# Patient Record
Sex: Male | Born: 1955 | Race: White | Hispanic: No | Marital: Single | Smoking: Former smoker
Health system: Southern US, Community
[De-identification: ages and names within clinical notes are randomized; demographics above are authoritative.]

## PROBLEM LIST (undated history)

## (undated) DIAGNOSIS — E119 Type 2 diabetes mellitus without complications: Secondary | ICD-10-CM

## (undated) DIAGNOSIS — Z6831 Body mass index (BMI) 31.0-31.9, adult: Secondary | ICD-10-CM

## (undated) DIAGNOSIS — E785 Hyperlipidemia, unspecified: Secondary | ICD-10-CM

## (undated) DIAGNOSIS — I10 Essential (primary) hypertension: Secondary | ICD-10-CM

## (undated) HISTORY — PX: JOINT REPLACEMENT: SHX530

## (undated) HISTORY — DX: Hyperlipidemia, unspecified: E78.5

## (undated) HISTORY — DX: Body mass index (BMI) 31.0-31.9, adult: Z68.31

## (undated) HISTORY — DX: Essential (primary) hypertension: I10

## (undated) HISTORY — DX: Type 2 diabetes mellitus without complications: E11.9

---

## 2020-04-01 ENCOUNTER — Ambulatory Visit (INDEPENDENT_AMBULATORY_CARE_PROVIDER_SITE_OTHER): Payer: 59 | Admitting: Osteopathic Medicine

## 2020-04-01 ENCOUNTER — Encounter: Payer: Self-pay | Admitting: Osteopathic Medicine

## 2020-04-01 VITALS — BP 116/73 | HR 96 | Wt 225.0 lb

## 2020-04-01 DIAGNOSIS — E1169 Type 2 diabetes mellitus with other specified complication: Secondary | ICD-10-CM | POA: Diagnosis not present

## 2020-04-01 DIAGNOSIS — J309 Allergic rhinitis, unspecified: Secondary | ICD-10-CM | POA: Diagnosis not present

## 2020-04-01 DIAGNOSIS — E119 Type 2 diabetes mellitus without complications: Secondary | ICD-10-CM

## 2020-04-01 DIAGNOSIS — I10 Essential (primary) hypertension: Secondary | ICD-10-CM

## 2020-04-01 DIAGNOSIS — E1159 Type 2 diabetes mellitus with other circulatory complications: Secondary | ICD-10-CM

## 2020-04-01 DIAGNOSIS — Z87891 Personal history of nicotine dependence: Secondary | ICD-10-CM

## 2020-04-01 DIAGNOSIS — Z125 Encounter for screening for malignant neoplasm of prostate: Secondary | ICD-10-CM

## 2020-04-01 DIAGNOSIS — E785 Hyperlipidemia, unspecified: Secondary | ICD-10-CM

## 2020-04-01 MED ORDER — LOSARTAN POTASSIUM 25 MG PO TABS
25.0000 mg | ORAL_TABLET | Freq: Every day | ORAL | 3 refills | Status: DC
Start: 2020-04-01 — End: 2021-04-27

## 2020-04-01 MED ORDER — ATORVASTATIN CALCIUM 80 MG PO TABS: 80.0000 mg | ORAL_TABLET | Freq: Every day | ORAL | 3 refills | Status: DC

## 2020-04-01 MED ORDER — LORATADINE 10 MG PO TABS: 10.0000 mg | ORAL_TABLET | Freq: Every day | ORAL | 3 refills | Status: DC

## 2020-04-01 MED ORDER — METFORMIN HCL 500 MG PO TABS: 500.0000 mg | ORAL_TABLET | Freq: Two times a day (BID) | ORAL | 3 refills | Status: DC

## 2020-04-01 NOTE — Progress Notes (Signed)
Brett Arnold is a 63 y.o. male who presents to  [place] at [facility]  today, 04/01/20, seeking care for the following: . New to establish  . DM2, HTN . Back pain      ASSESSMENT & PLAN with other pertinent history/findings:  The primary encounter diagnosis was Controlled type 2 diabetes mellitus without complication, without long-term current use of insulin ([place]). Diagnoses of Hyperlipidemia associated with type 2 diabetes mellitus ([place]), Hypertension associated with diabetes ([place]), Allergic rhinitis, unspecified seasonality, unspecified trigger, Former smoker, and Prostate cancer screening were also pertinent to this visit.   There are no Patient Instructions on file for this visit.   Orders Placed This Encounter  Procedures  . CBC  . COMPLETE METABOLIC PANEL WITH GFR  . Lipid panel  . Hemoglobin A1c  . PSA, Total with Reflex to PSA, Free    Meds ordered this encounter  Medications  . atorvastatin (LIPITOR) 80 MG tablet    Sig: Take 1 tablet (80 mg total) by mouth daily.    Dispense:  90 tablet    Refill:  3  . loratadine (CLARITIN) 10 MG tablet    Sig: Take 1 tablet (10 mg total) by mouth daily.    Dispense:  90 tablet    Refill:  3  . metFORMIN (GLUCOPHAGE) 500 MG tablet    Sig: Take 1 tablet (500 mg total) by mouth 2 (two) times daily with a meal.    Dispense:  180 tablet    Refill:  3  . losartan (COZAAR) 25 MG tablet    Sig: Take 1 tablet (25 mg total) by mouth daily.    Dispense:  90 tablet    Refill:  3       Follow-up instructions: Return in about 6 months (around 10/02/2020) for ANNUAL (call week prior to visit for lab orders).                                         BP 116/73 (BP Location: Right Arm, Patient Position: Sitting)   Pulse 96   Wt 225 lb (102.1 kg)   SpO2 96%   Current Meds  Medication Sig  . atorvastatin (LIPITOR) 80 MG tablet Take 1 tablet (80  mg total) by mouth daily.  Marland Kitchen loratadine (CLARITIN) 10 MG tablet Take 1 tablet (10 mg total) by mouth daily.  . metFORMIN (GLUCOPHAGE) 500 MG tablet Take 1 tablet (500 mg total) by mouth 2 (two) times daily with a meal.  . [DISCONTINUED] atorvastatin (LIPITOR) 80 MG tablet Take 80 mg by mouth daily.  . [DISCONTINUED] loratadine (CLARITIN) 10 MG tablet Take 10 mg by mouth daily.  . [DISCONTINUED] LOSARTAN POTASSIUM PO Take 25 mg by mouth daily.  . [DISCONTINUED] metFORMIN (GLUCOPHAGE) 500 MG tablet Take by mouth 2 (two) times daily with a meal.    No results found for this or any previous visit (from the past 72 hour(s)).  No results found.  Depression screen [facility] 2/9 04/01/2020  Decreased Interest 0  Down, Depressed, Hopeless 0  PHQ - 2 Score 0  Altered sleeping 0  Tired, decreased energy 1  Change in appetite 0  Feeling bad or failure about yourself  0  Trouble concentrating 0  Moving slowly or fidgety/restless 0  Suicidal thoughts 0  PHQ-9 Score 1    GAD 7 : Generalized Anxiety  Score 04/01/2020  Nervous, Anxious, on Edge 0  Control/stop worrying 0  Worry too much - different things 0  Trouble relaxing 0  Restless 0  Easily annoyed or irritable 0  Afraid - awful might happen 0  Total GAD 7 Score 0  Anxiety Difficulty Not difficult at all      All questions at time of visit were answered - patient instructed to contact office with any additional concerns or updates.  ER/RTC precautions were reviewed with the patient.  Please note: voice recognition software was used to produce this document, and typos may escape review. Please contact Dr. Lyn Hollingshead for any needed clarifications.

## 2020-04-08 LAB — PSA, TOTAL WITH REFLEX TO PSA, FREE: PSA, Total: 1.5 ng/mL (ref <=4.0)

## 2020-04-08 LAB — LIPID PANEL
Cholesterol: 165 mg/dL (ref <200)
HDL: 41 mg/dL (ref >=40)
LDL Cholesterol (Calc): 103 mg/dL (calc) — ABNORMAL HIGH
Non-HDL Cholesterol (Calc): 124 mg/dL (calc) (ref <130)
Total CHOL/HDL Ratio: 4 (calc) (ref <5.0)
Triglycerides: 114 mg/dL (ref <150)

## 2020-04-08 LAB — COMPLETE METABOLIC PANEL WITH GFR
AG Ratio: 1.9 (calc) (ref 1.0–2.5)
ALT: 31 U/L (ref 9–46)
AST: 15 U/L (ref 10–35)
Albumin: 4 g/dL (ref 3.6–5.1)
Alkaline phosphatase (APISO): 78 U/L (ref 35–144)
BUN: 12 mg/dL (ref 7–25)
CO2: 24 mmol/L (ref 20–32)
Calcium: 9.3 mg/dL (ref 8.6–10.3)
Chloride: 107 mmol/L (ref 98–110)
Creat: 0.9 mg/dL (ref 0.70–1.25)
GFR, Est African American: 106 mL/min/{1.73_m2} (ref >=60)
GFR, Est Non African American: 91 mL/min/{1.73_m2} (ref >=60)
Globulin: 2.1 g/dL (calc) (ref 1.9–3.7)
Glucose, Bld: 146 mg/dL — ABNORMAL HIGH (ref 65–99)
Potassium: 4.2 mmol/L (ref 3.5–5.3)
Sodium: 140 mmol/L (ref 135–146)
Total Bilirubin: 0.6 mg/dL (ref 0.2–1.2)
Total Protein: 6.1 g/dL (ref 6.1–8.1)

## 2020-04-08 LAB — CBC
HCT: 44.4 % (ref 38.5–50.0)
Hemoglobin: 14.8 g/dL (ref 13.2–17.1)
MCH: 29.6 pg (ref 27.0–33.0)
MCHC: 33.3 g/dL (ref 32.0–36.0)
MCV: 88.8 fL (ref 80.0–100.0)
MPV: 11.1 fL (ref 7.5–12.5)
Platelets: 274 10*3/uL (ref 140–400)
RBC: 5 10*6/uL (ref 4.20–5.80)
RDW: 12.1 % (ref 11.0–15.0)
WBC: 6.2 10*3/uL (ref 3.8–10.8)

## 2020-04-08 LAB — HEMOGLOBIN A1C
Hgb A1c MFr Bld: 7.1 % of total Hgb — ABNORMAL HIGH (ref <5.7)
Mean Plasma Glucose: 157 (calc)
eAG (mmol/L): 8.7 (calc)

## 2020-08-26 ENCOUNTER — Emergency Department (INDEPENDENT_AMBULATORY_CARE_PROVIDER_SITE_OTHER)
Admission: EM | Admit: 2020-08-26 | Discharge: 2020-08-26 | Disposition: A | Discharge status: Home / Self Care | Payer: 59 | Source: Home / Self Care

## 2020-08-26 ENCOUNTER — Other Ambulatory Visit: Payer: Self-pay

## 2020-08-26 ENCOUNTER — Telehealth: Payer: Self-pay

## 2020-08-26 DIAGNOSIS — J029 Acute pharyngitis, unspecified: Secondary | ICD-10-CM

## 2020-08-26 LAB — POCT RAPID STREP A (OFFICE): Rapid Strep A Screen: NEGATIVE

## 2020-08-26 NOTE — Telephone Encounter (Signed)
Pt called requesting an urgent appointment. Per pt, having a sore throat with some coughing since yesterday. Pt informed no appointments available today. Offered an appointment for Monday, however pt did not want to wait. Advised to seek care at [facility]. Pt was agreeable with the plan.

## 2020-08-26 NOTE — Discharge Instructions (Signed)
  You may take 500mg acetaminophen every 4-6 hours or in combination with ibuprofen 400-600mg every 6-8 hours as needed for pain, inflammation, and fever.  Be sure to well hydrated with clear liquids and get at least 8 hours of sleep at night, preferably more while sick.   Please follow up with family medicine in 1 week if needed.   

## 2020-08-26 NOTE — ED Triage Notes (Signed)
Pt developed a sore throat last night. Has used otc throat spray without any relief. Denies being around anyone sick. Not vaccinated for flu/covid.

## 2020-08-26 NOTE — ED Provider Notes (Signed)
[name]    CSN: [id number] Arrival date & time: 08/26/20  1045      History   Chief Complaint Chief Complaint  Patient presents with  . Sore Throat    HPI Brett Arnold is a 64 y.o. male.   HPI  Brett Arnold is a 64 y.o. male presenting to UC with c/o sore throat that started last night, pain is worse with swallowing. Pain is 2/10, burning and sore. He has tried chloraseptic spray.  Denies fever, chills, cough, congestion, HA or ear pain. He is not vaccinated for flu or COVID. Denies sick contacts or recent travel.    Past Medical History:  Diagnosis Date  . Diabetes (HCC)   . Hyperlipemia   . Hypertension     There are no problems to display for this patient.   History reviewed. No pertinent surgical history.     Home Medications    Prior to Admission medications   Medication Sig Start Date End Date Taking? Authorizing Provider  atorvastatin (LIPITOR) 80 MG tablet Take 1 tablet (80 mg total) by mouth daily. 04/01/20   Sunnie Nielsen, DO  loratadine (CLARITIN) 10 MG tablet Take 1 tablet (10 mg total) by mouth daily. 04/01/20   Sunnie Nielsen, DO  LORATADINE PO Take by mouth.    [provider]  losartan (COZAAR) 25 MG tablet Take 1 tablet (25 mg total) by mouth daily. 04/01/20   Sunnie Nielsen, DO  LOSARTAN POTASSIUM PO Take by mouth.    [provider]  metFORMIN (GLUCOPHAGE) 500 MG tablet Take 1 tablet (500 mg total) by mouth 2 (two) times daily with a meal. 04/01/20   Sunnie Nielsen, DO  METFORMIN HCL PO Take by mouth.    [provider]    Family History Family History  Problem Relation Age of Onset  . Emphysema Mother   . Lung disease Mother        lung cancer fluid  . Hypertension Father   . Stroke Father   . Heart failure Brother     Social History Social History   Tobacco Use  . Smoking status: Former Games developer  . Smokeless tobacco: Never Used  Vaping Use  . Vaping Use: Never used    Substance Use Topics  . Alcohol use: Yes    Comment: 2x month  . Drug use: Not Currently     Allergies   Patient has no known allergies.   Review of Systems Review of Systems  Constitutional: Negative for chills and fever.  HENT: Positive for sore throat. Negative for congestion, ear pain, trouble swallowing and voice change.   Respiratory: Negative for cough and shortness of breath.   Cardiovascular: Negative for chest pain and palpitations.  Gastrointestinal: Negative for abdominal pain, diarrhea, nausea and vomiting.  Musculoskeletal: Negative for arthralgias, back pain and myalgias.  Skin: Negative for rash.  All other systems reviewed and are negative.    Physical Exam Triage Vital Signs ED Triage Vitals  Enc Vitals Group     BP 08/26/20 1101 (!) 154/80     Pulse Rate 08/26/20 1101 74     Resp 08/26/20 1101 17     Temp 08/26/20 1101 98.6 F (37 C)     Temp Source 08/26/20 1101 Oral     SpO2 08/26/20 1101 95 %     Weight 08/26/20 1056 210 lb (95.3 kg)     Height 08/26/20 1056 5\' 10"  (1.778 m)     Head Circumference --  Peak Flow --      Pain Score 08/26/20 1056 2     Pain Loc --      Pain Edu? --      Excl. in GC? --    No data found.  Updated Vital Signs BP (!) 154/80 (BP Location: Left Arm)   Pulse 74   Temp 98.6 F (37 C) (Oral)   Resp 17   Ht 5\' 10"  (1.778 m)   Wt 210 lb (95.3 kg)   SpO2 95%   BMI 30.13 kg/m   Visual Acuity Right Eye Distance:   Left Eye Distance:   Bilateral Distance:    Right Eye Near:   Left Eye Near:    Bilateral Near:     Physical Exam Vitals and nursing note reviewed.  Constitutional:      General: He is not in acute distress.    Appearance: He is well-developed. He is not ill-appearing, toxic-appearing or diaphoretic.  HENT:     Head: Normocephalic and atraumatic.     Right Ear: Tympanic membrane and ear canal normal.     Left Ear: Tympanic membrane and ear canal normal.     Nose: Nose normal.      Mouth/Throat:     Lips: Pink.     Mouth: Mucous membranes are moist.     Pharynx: Oropharynx is clear. Uvula midline. Posterior oropharyngeal erythema present. No pharyngeal swelling, oropharyngeal exudate or uvula swelling.     Tonsils: No tonsillar exudate or tonsillar abscesses.  Cardiovascular:     Rate and Rhythm: Normal rate and regular rhythm.  Pulmonary:     Effort: Pulmonary effort is normal. No respiratory distress.     Breath sounds: Normal breath sounds. No stridor. No wheezing, rhonchi or rales.  Musculoskeletal:        General: Normal range of motion.     Cervical back: Normal range of motion and neck supple.  Lymphadenopathy:     Cervical: No cervical adenopathy.  Skin:    General: Skin is warm and dry.  Neurological:     Mental Status: He is alert and oriented to person, place, and time.  Psychiatric:        Behavior: Behavior normal.      UC Treatments / Results  Labs (all labs ordered are listed, but only abnormal results are displayed) Labs Reviewed  POCT RAPID STREP A (OFFICE)    EKG   Radiology No results found.  Procedures Procedures (including critical care time)  Medications Ordered in UC Medications - No data to display  Initial Impression / Assessment and Plan / UC Course  I have reviewed the triage vital signs and the nursing notes.  Pertinent labs & imaging results that were available during my care of the patient were reviewed by me and considered in my medical decision making (see chart for details).     Rapid strep: NEGATIVE Pt declined COVID testing Hx and exam c/w viral pharyngitis Encouraged symptomatic tx F/u with PCP  Final Clinical Impressions(s) / UC Diagnoses   Final diagnoses:  Acute pharyngitis, unspecified etiology     Discharge Instructions      You may take 500mg  acetaminophen every 4-6 hours or in combination with ibuprofen 400-600mg  every 6-8 hours as needed for pain, inflammation, and fever.  Be sure  to well hydrated with clear liquids and get at least 8 hours of sleep at night, preferably more while sick.   Please follow up with family medicine in 1 week  if needed.     ED Prescriptions    None     PDMP not reviewed this encounter.   Lurene Shadow, [place] 08/26/20 1137

## 2020-08-28 LAB — CULTURE, GROUP A STREP
MICRO NUMBER:: 11111901
SPECIMEN QUALITY:: ADEQUATE

## 2020-08-28 LAB — STREP A DNA PROBE

## 2020-09-01 ENCOUNTER — Other Ambulatory Visit: Payer: Self-pay

## 2020-09-01 ENCOUNTER — Encounter: Payer: Self-pay | Admitting: Osteopathic Medicine

## 2020-09-01 ENCOUNTER — Ambulatory Visit (INDEPENDENT_AMBULATORY_CARE_PROVIDER_SITE_OTHER): Payer: 59 | Admitting: Osteopathic Medicine

## 2020-09-01 VITALS — BP 145/85 | HR 65 | Temp 97.5°F | Wt 221.1 lb

## 2020-09-01 DIAGNOSIS — Z20822 Contact with and (suspected) exposure to covid-19: Secondary | ICD-10-CM

## 2020-09-01 DIAGNOSIS — J3489 Other specified disorders of nose and nasal sinuses: Secondary | ICD-10-CM | POA: Diagnosis not present

## 2020-09-01 DIAGNOSIS — R059 Cough, unspecified: Secondary | ICD-10-CM

## 2020-09-01 DIAGNOSIS — B9789 Other viral agents as the cause of diseases classified elsewhere: Secondary | ICD-10-CM

## 2020-09-01 DIAGNOSIS — J988 Other specified respiratory disorders: Secondary | ICD-10-CM

## 2020-09-01 MED ORDER — AMBULATORY NON FORMULARY MEDICATION: 99 refills | Status: AC

## 2020-09-01 MED ORDER — METFORMIN HCL ER 500 MG PO TB24: 500.0000 mg | ORAL_TABLET | Freq: Every day | ORAL | 11 refills | Status: DC

## 2020-09-01 MED ORDER — IPRATROPIUM BROMIDE 0.06 % NA SOLN: 2.0000 | Freq: Four times a day (QID) | NASAL | 1 refills | Status: DC

## 2020-09-01 MED ORDER — PREDNISONE 20 MG PO TABS: 20.0000 mg | ORAL_TABLET | Freq: Two times a day (BID) | ORAL | 0 refills | Status: DC

## 2020-09-01 NOTE — Progress Notes (Signed)
HPI: Brett Arnold is a 64 y.o. male who  has a past medical history of Diabetes (HCC), Hyperlipemia, and Hypertension.  he presents to [facility] today, 09/01/20,  for chief complaint of:  Follow up for sore throat / new sinus congestion R hand and forearm swelling   Sore throat began 10/22 and went to ED where he was diagnosed with viral pharyngitis. He used alleve at home and rested. Sore throat cleared after a day (10/25) then congestion began 10/27 after sore throat cleared.   He is currently not experiencing a sore throat, but does endorse congestion, nasal drainage, and cough productive for green tinged sputum. He denies fever, headache, change to smell or taste, nausea, vomiting, shortness of breath, or sick contacts. He is not vaccinated for flu or COVID19.     He also complains of R arm swelling that has been persistent for about 2 weeks since his last hike. He states that he normally has some swelling after a hike, but that it has never been this persistent in the past. He denies pain, but endorses a "stretching" feeling and is unable to make a fist due to this. He has not had any recent trauma to the R arm or shoulder.     Unrelated to current presentation patient endorses chronic diarrhea since metformin medication change a few months ago. He also is requesting aperscription for CPAP supplies.       Past medical, surgical, social and family history reviewed:  There are no problems to display for this patient.   No past surgical history on file.  Social History   Tobacco Use   Smoking status: Former Smoker   Smokeless tobacco: Never Used  Substance Use Topics   Alcohol use: Yes    Comment: 2x month    Family History  Problem Relation Age of Onset   Emphysema Mother    Lung disease Mother        lung cancer fluid   Hypertension Father    Stroke Father    Heart failure Brother      Current medication list and  allergy/intolerance information reviewed:    Current Outpatient Medications  Medication Sig Dispense Refill   atorvastatin (LIPITOR) 80 MG tablet Take 1 tablet (80 mg total) by mouth daily. 90 tablet 3   loratadine (CLARITIN) 10 MG tablet Take 1 tablet (10 mg total) by mouth daily. 90 tablet 3   LORATADINE PO Take by mouth.     losartan (COZAAR) 25 MG tablet Take 1 tablet (25 mg total) by mouth daily. 90 tablet 3   LOSARTAN POTASSIUM PO Take by mouth.     metFORMIN (GLUCOPHAGE) 500 MG tablet Take 1 tablet (500 mg total) by mouth 2 (two) times daily with a meal. 180 tablet 3   METFORMIN HCL PO Take by mouth.     ipratropium (ATROVENT) 0.06 % nasal spray Place 2 sprays into both nostrils 4 (four) times daily. As needed for runny nose / postnasal drip 15 mL 1   predniSONE (DELTASONE) 20 MG tablet Take 1 tablet (20 mg total) by mouth 2 (two) times daily with a meal. 10 tablet 0   No current facility-administered medications for this visit.    No Known Allergies    Review of Systems:  Constitutional:  No  fever, no chills.  HEENT: No  headache, No sore throat, + congestion, + nasal drainage, no change in smell, no change in taste  Respiratory:  No  shortness of breath. +  Cough (green sputum)  Gastrointestinal: No  abdominal pain, No  nausea, No  vomiting,   +  diarrhea, No  constipation   Musculoskeletal: No new myalgia/arthralgia  Cardio: + extremity swelling in R arm   Neurologic: No  weakness, No  dizziness, No  slurred speech/focal weakness/facial droop  Psychiatric: No  concerns with depression, No  concerns with anxiety, No sleep problems, No mood problems  Exam:  BP (!) 145/85 (BP Location: Left Arm, Patient Position: Sitting, Cuff Size: Large)    Pulse 65    Temp (!) 97.5 F (36.4 C) (Oral)    Wt 221 lb 1.9 oz (100.3 kg)    BMI 31.73 kg/m   Constitutional: VS see above. General Appearance: alert, well-developed, well-nourished, NAD  Eyes: Normal lids and  conjunctive, non-icteric sclera  Ears, Nose, Mouth, Throat: MMM, Normal external inspection ears/nares/mouth/lips/gums. TM normal bilaterally. Pharynx/tonsils no erythema,  Nasal mucosa normal. Some nasal discharge present.  Neck: No masses, trachea midline. . No tenderness/mass appreciated. No lymphadenopathy  Respiratory: Normal respiratory effort. No wheeze, no rhonchi, no rales  Cardiovascular: S1/S2 normal, no murmur, no rub/gallop auscultated. RRR.  Gastrointestinal: Soft, Nontender, no masses. No hepatomegaly, no splenomegaly. No hernia appreciated.Rectal exam deferred.   Skin: warm, dry, intact.   MSK: Swelling present from R hand to halfway up R forearm     No results found for this or any previous visit (from the past 72 hour(s)).  No results found.   ASSESSMENT/PLAN: The primary encounter diagnosis was Rhinorrhea. Diagnoses of Cough, Viral respiratory illness, and Suspected COVID-19 virus infection were also pertinent to this visit.   Rhinorrhea: Concern for COVID19 v other viral illness v allergies  COVID19 swab  Tx symptoms w/ 5 day course of prednisone, ipratropium nasal spray   DM2 w/ side effects of metformin causing diarrhea:   Switch back to metformin XR due to side effect with instant release, if no better will d/c metformin altogether   Sleep apnea  Using and benefiting from CPAP for OSA.  Will reorder CPAP supplies     Orders Placed This Encounter  Procedures   Novel Coronavirus, NAA ([facility])    Meds ordered this encounter  Medications   predniSONE (DELTASONE) 20 MG tablet    Sig: Take 1 tablet (20 mg total) by mouth 2 (two) times daily with a meal.    Dispense:  10 tablet    Refill:  0   ipratropium (ATROVENT) 0.06 % nasal spray    Sig: Place 2 sprays into both nostrils 4 (four) times daily. As needed for runny nose / postnasal drip    Dispense:  15 mL    Refill:  1    Patient Instructions  Plan:  COVID swab today. Results may  take a couple days to come back.   Please ALWAYS WEAR MASK COVERING NOSE/MOUTH when out of the house.   Hopefully this is just allergies. It's ragweed season! Will treat w/ steroids and nasal spray to calm this down. For long-term allergy treatment, recommend Claritin or similar antihistamine +/- Flonase or similar steroid nasal spray.   Will go back to Metformin XR  Will reorder CPAP supplies  Please use wrist brace for a few weeks during day AND night. If swelling persists or gets worse please schedule follow-up with Dr T our sports med specialist here in the office.         Visit summary with medication list and pertinent instructions was printed for  patient to review. All questions at time of visit were answered - patient instructed to contact office with any additional concerns or updates. ER/RTC precautions were reviewed with the patient.     Follow-up plan: Return if symptoms worsen or fail to improve.

## 2020-09-01 NOTE — Patient Instructions (Signed)
Plan:  COVID swab today. Results may take a couple days to come back.   Please ALWAYS WEAR MASK COVERING NOSE/MOUTH when out of the house.   Hopefully this is just allergies. It's ragweed season! Will treat w/ steroids and nasal spray to calm this down. For long-term allergy treatment, recommend Claritin or similar antihistamine +/- Flonase or similar steroid nasal spray.   Will go back to Metformin XR  Will reorder CPAP supplies  Please use wrist brace for a few weeks during day AND night. If swelling persists or gets worse please schedule follow-up with Dr T our sports med specialist here in the office.

## 2020-09-03 LAB — NOVEL CORONAVIRUS, NAA: SARS-CoV-2, NAA: NOT DETECTED

## 2020-09-03 LAB — SARS-COV-2, NAA 2 DAY TAT

## 2020-09-20 ENCOUNTER — Encounter: Payer: Self-pay | Admitting: Osteopathic Medicine

## 2020-10-17 ENCOUNTER — Other Ambulatory Visit: Payer: Self-pay

## 2020-10-17 ENCOUNTER — Emergency Department (INDEPENDENT_AMBULATORY_CARE_PROVIDER_SITE_OTHER): Payer: 59

## 2020-10-17 ENCOUNTER — Emergency Department (INDEPENDENT_AMBULATORY_CARE_PROVIDER_SITE_OTHER)
Admission: EM | Admit: 2020-10-17 | Discharge: 2020-10-17 | Disposition: A | Discharge status: Home / Self Care | Payer: 59 | Source: Home / Self Care | Attending: Family Medicine | Admitting: Family Medicine

## 2020-10-17 DIAGNOSIS — R911 Solitary pulmonary nodule: Secondary | ICD-10-CM | POA: Diagnosis not present

## 2020-10-17 DIAGNOSIS — U071 COVID-19: Secondary | ICD-10-CM | POA: Diagnosis not present

## 2020-10-17 DIAGNOSIS — J1282 Pneumonia due to coronavirus disease 2019: Secondary | ICD-10-CM

## 2020-10-17 DIAGNOSIS — R059 Cough, unspecified: Secondary | ICD-10-CM

## 2020-10-17 DIAGNOSIS — J069 Acute upper respiratory infection, unspecified: Secondary | ICD-10-CM

## 2020-10-17 MED ORDER — ACETAMINOPHEN 325 MG PO TABS
650.0000 mg | ORAL_TABLET | Freq: Four times a day (QID) | ORAL | Status: DC | PRN
  Administered 2020-10-17: 18:00:00 650 mg via ORAL

## 2020-10-17 MED ORDER — DOXYCYCLINE HYCLATE 100 MG PO CAPS: 100.0000 mg | ORAL_CAPSULE | Freq: Two times a day (BID) | ORAL | 0 refills | Status: DC

## 2020-10-17 MED ORDER — AZITHROMYCIN 100 MG/5ML PO SUSR: 100.0000 mg | Freq: Every day | ORAL | 0 refills | Status: DC

## 2020-10-17 NOTE — ED Provider Notes (Signed)
[facility]    CSN: [id number] Arrival date & time: 10/17/20  1753      History   Chief Complaint Chief Complaint  Patient presents with  . Cough    HPI JAICION LAURIE is a 64 y.o. male.   1 week history of mostly dry cough but now with fever and chills.  No known exposure to Covid but he has not been vaccinated or tested recently.  HPI  Past Medical History:  Diagnosis Date  . Diabetes (HCC)   . Hyperlipemia   . Hypertension     There are no problems to display for this patient.   History reviewed. No pertinent surgical history.     Home Medications    Prior to Admission medications   Medication Sig Start Date End Date Taking? Authorizing Provider  AMBULATORY NON FORMULARY MEDICATION Supply ordered: CPAP and other supplies needed (headgear, cushions, filters, heated tuubing and water chamber) Dx: obstructive sleep apnea Settings: auto-titration 09/01/20   Sunnie Nielsen, DO  atorvastatin (LIPITOR) 80 MG tablet Take 1 tablet (80 mg total) by mouth daily. 04/01/20   Sunnie Nielsen, DO  ipratropium (ATROVENT) 0.06 % nasal spray Place 2 sprays into both nostrils 4 (four) times daily. As needed for runny nose / postnasal drip 09/01/20   Sunnie Nielsen, DO  loratadine (CLARITIN) 10 MG tablet Take 1 tablet (10 mg total) by mouth daily. 04/01/20   Sunnie Nielsen, DO  LORATADINE PO Take by mouth.    [provider]  losartan (COZAAR) 25 MG tablet Take 1 tablet (25 mg total) by mouth daily. 04/01/20   Sunnie Nielsen, DO  LOSARTAN POTASSIUM PO Take by mouth.    [provider]  metFORMIN (GLUCOPHAGE XR) 500 MG 24 hr tablet Take 1 tablet (500 mg total) by mouth daily with breakfast. 09/01/20 09/01/21  Sunnie Nielsen, DO  predniSONE (DELTASONE) 20 MG tablet Take 1 tablet (20 mg total) by mouth 2 (two) times daily with a meal. 09/01/20   Sunnie Nielsen, DO    Family History Family History  Problem Relation Age of Onset   . Emphysema Mother   . Lung disease Mother        lung cancer fluid  . Hypertension Father   . Stroke Father   . Heart failure Brother     Social History Social History   Tobacco Use  . Smoking status: Former Games developer  . Smokeless tobacco: Never Used  Vaping Use  . Vaping Use: Never used  Substance Use Topics  . Alcohol use: Yes    Comment: 2x month  . Drug use: Not Currently     Allergies   Patient has no known allergies.   Review of Systems Review of Systems  Constitutional: Positive for fever.  HENT: Positive for congestion.   Respiratory: Positive for cough.   All other systems reviewed and are negative.    Physical Exam Triage Vital Signs ED Triage Vitals  Enc Vitals Group     BP 10/17/20 1814 (!) 150/87     Pulse Rate 10/17/20 1814 93     Resp 10/17/20 1814 16     Temp 10/17/20 1814 (!) 100.5 F (38.1 C)     Temp Source 10/17/20 1814 Oral     SpO2 10/17/20 1814 96 %     Weight --      Height --      Head Circumference --      Peak Flow --      Pain  Score 10/17/20 1812 0     Pain Loc --      Pain Edu? --      Excl. in GC? --    No data found.  Updated Vital Signs BP (!) 150/87 (BP Location: Right Arm)   Pulse 93   Temp (!) 100.5 F (38.1 C) (Oral)   Resp 16   SpO2 96%   Visual Acuity Right Eye Distance:   Left Eye Distance:   Bilateral Distance:    Right Eye Near:   Left Eye Near:    Bilateral Near:     Physical Exam Vitals and nursing note reviewed.  Constitutional:      Appearance: Normal appearance.  HENT:     Head: Normocephalic.     Nose: Nose normal.     Mouth/Throat:     Mouth: Mucous membranes are moist.  Cardiovascular:     Rate and Rhythm: Normal rate and regular rhythm.  Pulmonary:     Effort: Pulmonary effort is normal.     Breath sounds: Wheezing and rhonchi present.  Neurological:     Mental Status: He is alert.      UC Treatments / Results  Labs (all labs ordered are listed, but only abnormal results  are displayed) Labs Reviewed  NOVEL CORONAVIRUS, NAA    EKG   Radiology No results found.  Procedures Procedures (including critical care time)  Medications Ordered in UC Medications  acetaminophen (TYLENOL) tablet 650 mg (650 mg Oral Given 10/17/20 1819)    Initial Impression / Assessment and Plan / UC Course  I have reviewed the triage vital signs and the nursing notes.  Pertinent labs & imaging results that were available during my care of the patient were reviewed by me and considered in my medical decision making (see chart for details).     Pneumonia Final Clinical Impressions(s) / UC Diagnoses   Final diagnoses:  Cough   Discharge Instructions   None    ED Prescriptions    None     PDMP not reviewed this encounter.   Frederica Kuster, MD 10/17/20 1911

## 2020-10-17 NOTE — ED Triage Notes (Signed)
Patient presents to Urgent Care with complaints of dry cough since a little over a week now. Patient reports he feels dehydrated but has had about 12 bottles of water, is a known diabetic and takes medication as directed. Pt reports feeling like he cannot get warm, which is unusual for him.  Pt has not been vaccinated or tested for covid recently.

## 2020-10-19 ENCOUNTER — Telehealth: Payer: Self-pay

## 2020-10-19 LAB — SARS-COV-2, NAA 2 DAY TAT

## 2020-10-19 LAB — NOVEL CORONAVIRUS, NAA: SARS-CoV-2, NAA: DETECTED — AB

## 2020-10-19 NOTE — Telephone Encounter (Signed)
Brett Arnold states he is positive for COVID and wanted to know if he could get the infusion.

## 2020-10-20 ENCOUNTER — Encounter: Payer: Self-pay | Admitting: Physician Assistant

## 2020-10-20 ENCOUNTER — Other Ambulatory Visit: Payer: Self-pay | Admitting: Family

## 2020-10-20 ENCOUNTER — Other Ambulatory Visit: Payer: Self-pay | Admitting: Physician Assistant

## 2020-10-20 ENCOUNTER — Ambulatory Visit (HOSPITAL_COMMUNITY)
Admission: RE | Admit: 2020-10-20 | Discharge: 2020-10-20 | Disposition: A | Discharge status: Home / Self Care | Payer: 59 | Source: Ambulatory Visit | Attending: Pulmonary Disease | Admitting: Pulmonary Disease

## 2020-10-20 DIAGNOSIS — I10 Essential (primary) hypertension: Secondary | ICD-10-CM

## 2020-10-20 DIAGNOSIS — Z6831 Body mass index (BMI) 31.0-31.9, adult: Secondary | ICD-10-CM | POA: Insufficient documentation

## 2020-10-20 DIAGNOSIS — J1282 Pneumonia due to coronavirus disease 2019: Secondary | ICD-10-CM | POA: Diagnosis not present

## 2020-10-20 DIAGNOSIS — U071 COVID-19: Secondary | ICD-10-CM | POA: Insufficient documentation

## 2020-10-20 DIAGNOSIS — E1169 Type 2 diabetes mellitus with other specified complication: Secondary | ICD-10-CM | POA: Diagnosis not present

## 2020-10-20 DIAGNOSIS — E119 Type 2 diabetes mellitus without complications: Secondary | ICD-10-CM | POA: Insufficient documentation

## 2020-10-20 DIAGNOSIS — E785 Hyperlipidemia, unspecified: Secondary | ICD-10-CM | POA: Insufficient documentation

## 2020-10-20 MED ORDER — FAMOTIDINE IN NACL 20-0.9 MG/50ML-% IV SOLN: 20.0000 mg | Freq: Once | INTRAVENOUS | Status: DC | PRN

## 2020-10-20 MED ORDER — SODIUM CHLORIDE 0.9 % IV SOLN: Freq: Once | INTRAVENOUS | Status: AC

## 2020-10-20 MED ORDER — ALBUTEROL SULFATE HFA 108 (90 BASE) MCG/ACT IN AERS: 2.0000 | INHALATION_SPRAY | Freq: Once | RESPIRATORY_TRACT | Status: DC | PRN

## 2020-10-20 MED ORDER — ACETAMINOPHEN 325 MG PO TABS
650.0000 mg | ORAL_TABLET | Freq: Four times a day (QID) | ORAL | Status: DC | PRN
  Administered 2020-10-20: 17:00:00 650 mg via ORAL
  Filled 2020-10-20: qty 2

## 2020-10-20 MED ORDER — SODIUM CHLORIDE 0.9 % IV BOLUS
1000.0000 mL | Freq: Once | INTRAVENOUS | Status: AC
  Administered 2020-10-20: 1000 mL via INTRAVENOUS

## 2020-10-20 MED ORDER — DIPHENHYDRAMINE HCL 50 MG/ML IJ SOLN: 50.0000 mg | Freq: Once | INTRAMUSCULAR | Status: DC | PRN

## 2020-10-20 MED ORDER — METHYLPREDNISOLONE SODIUM SUCC 125 MG IJ SOLR: 125.0000 mg | Freq: Once | INTRAMUSCULAR | Status: DC | PRN

## 2020-10-20 MED ORDER — BENZONATATE 100 MG PO CAPS: 100.0000 mg | ORAL_CAPSULE | Freq: Four times a day (QID) | ORAL | 1 refills | Status: DC | PRN

## 2020-10-20 MED ORDER — SODIUM CHLORIDE 0.9 % IV SOLN: INTRAVENOUS | Status: DC | PRN

## 2020-10-20 MED ORDER — EPINEPHRINE 0.3 MG/0.3ML IJ SOAJ: 0.3000 mg | Freq: Once | INTRAMUSCULAR | Status: DC | PRN

## 2020-10-20 NOTE — Progress Notes (Signed)
Patient reviewed Fact Sheet for Patients, Parents, and Caregivers for Emergency Use Authorization (EUA) of bamlanivimab and etesevimab for the Treatment of Coronavirus. Patient also reviewed and is agreeable to the estimated cost of treatment. Patient is agreeable to proceed.   

## 2020-10-20 NOTE — Discharge Instructions (Signed)
10 Things You Can Do to Manage Your COVID-19 Symptoms at Home If you have possible or confirmed COVID-19: 1. Stay home from work and school. And stay away from other public places. If you must go out, avoid using any kind of public transportation, ridesharing, or taxis. 2. Monitor your symptoms carefully. If your symptoms get worse, call your healthcare provider immediately. 3. Get rest and stay hydrated. 4. If you have a medical appointment, call the healthcare provider ahead of time and tell them that you have or may have COVID-19. 5. For medical emergencies, call 911 and notify the dispatch personnel that you have or may have COVID-19. 6. Cover your cough and sneezes with a tissue or use the inside of your elbow. 7. Wash your hands often with soap and water for at least 20 seconds or clean your hands with an alcohol-based hand sanitizer that contains at least 60% alcohol. 8. As much as possible, stay in a specific room and away from other people in your home. Also, you should use a separate bathroom, if available. If you need to be around other people in or outside of the home, wear a mask. 9. Avoid sharing personal items with other people in your household, like dishes, towels, and bedding. 10. Clean all surfaces that are touched often, like counters, tabletops, and doorknobs. Use household cleaning sprays or wipes according to the label instructions. cdc.gov/coronavirus 05/06/2019 This information is not intended to replace advice given to you by your health care provider. Make sure you discuss any questions you have with your health care provider. Document Revised: 10/08/2019 Document Reviewed: 10/08/2019 Elsevier Patient Education  2020 Elsevier Inc. What types of side effects do monoclonal antibody drugs cause?  Common side effects  In general, the more common side effects caused by monoclonal antibody drugs include: . Allergic reactions, such as hives or itching . Flu-like signs and  symptoms, including chills, fatigue, fever, and muscle aches and pains . Nausea, vomiting . Diarrhea . Skin rashes . Low blood pressure   The CDC is recommending patients who receive monoclonal antibody treatments wait at least 90 days before being vaccinated.  Currently, there are no data on the safety and efficacy of mRNA COVID-19 vaccines in persons who received monoclonal antibodies or convalescent plasma as part of COVID-19 treatment. Based on the estimated half-life of such therapies as well as evidence suggesting that reinfection is uncommon in the 90 days after initial infection, vaccination should be deferred for at least 90 days, as a precautionary measure until additional information becomes available, to avoid interference of the antibody treatment with vaccine-induced immune responses. If you have any questions or concerns after the infusion please call the Advanced Practice Provider on call at [phone]. This number is ONLY intended for your use regarding questions or concerns about the infusion post-treatment side-effects.  Please do not provide this number to others for use. For return to work notes please contact your primary care provider.   If someone you know is interested in receiving treatment please have them call the COVID hotline at [phone].   

## 2020-10-20 NOTE — Progress Notes (Signed)
I connected by phone with Brett Arnold on 10/20/2020 at 9:25 AM to discuss the potential use of a new treatment for mild to moderate COVID-19 viral infection in non-hospitalized patients.  This patient is a 64 y.o. male that meets the FDA criteria for Emergency Use Authorization of COVID monoclonal antibody sotrovimab, casirivimab/imdevimab or bamlamivimab/estevimab.  Has a (+) direct SARS-CoV-2 viral test result  Has mild or moderate COVID-19   Is NOT hospitalized due to COVID-19  Is within 10 days of symptom onset  Has at least one of the high risk factor(s) for progression to severe COVID-19 and/or hospitalization as defined in EUA.  Specific high risk criteria : BMI > 25, Diabetes and Cardiovascular disease or hypertension   I have spoken and communicated the following to the patient or parent/caregiver regarding COVID monoclonal antibody treatment:  1. FDA has authorized the emergency use for the treatment of mild to moderate COVID-19 in adults and pediatric patients with positive results of direct SARS-CoV-2 viral testing who are 81 years of age and older weighing at least 40 kg, and who are at high risk for progressing to severe COVID-19 and/or hospitalization.  2. The significant known and potential risks and benefits of COVID monoclonal antibody, and the extent to which such potential risks and benefits are unknown.  3. Information on available alternative treatments and the risks and benefits of those alternatives, including clinical trials.  4. Patients treated with COVID monoclonal antibody should continue to self-isolate and use infection control measures (e.g., wear mask, isolate, social distance, avoid sharing personal items, clean and disinfect "high touch" surfaces, and frequent handwashing) according to CDC guidelines.   5. The patient or parent/caregiver has the option to accept or refuse COVID monoclonal antibody treatment.  After reviewing this information with the  patient, the patient has agreed to receive one of the available covid 19 monoclonal antibodies and will be provided an appropriate fact sheet prior to infusion.  Sx onset 12/6. Set up for infusion on 12/16 @ 4:30pm. Directions given to [facility]. Pt is aware that insurance will be charged an infusion fee. Pt is unvaccinated. + in epic.   Cline Crock 10/20/2020 9:25 AM

## 2020-10-20 NOTE — Telephone Encounter (Addendum)
With his history of diabetes, hypertension, and BMI of 31.73, I would strongly encourage the infusion. He has to be within 10 days of symptom onset to qualify. It looks like based on the UC note he is day 10 today. I reached out to the infusion clinic people working today and asked them to give him a call in hopes that we can get him on the schedule this afternoon if he is interested. Please let him know that someone will be calling and to be sure to answer the phone- with the volumes as high as they are, they may not be able to circle back around to call him again today if they miss him.   Thank you!

## 2020-11-23 ENCOUNTER — Encounter: Payer: Self-pay | Admitting: Osteopathic Medicine

## 2020-11-23 DIAGNOSIS — M19049 Primary osteoarthritis, unspecified hand: Secondary | ICD-10-CM

## 2020-11-23 NOTE — Telephone Encounter (Signed)
Referral pended for provider.  

## 2020-12-02 ENCOUNTER — Ambulatory Visit (INDEPENDENT_AMBULATORY_CARE_PROVIDER_SITE_OTHER): Payer: 59

## 2020-12-02 ENCOUNTER — Ambulatory Visit: Payer: 59 | Admitting: Orthopaedic Surgery

## 2020-12-02 ENCOUNTER — Encounter: Payer: Self-pay | Admitting: Orthopaedic Surgery

## 2020-12-02 DIAGNOSIS — M79641 Pain in right hand: Secondary | ICD-10-CM

## 2020-12-02 MED ORDER — LIDOCAINE HCL 1 % IJ SOLN
0.3000 mL | INTRAMUSCULAR | Status: AC | PRN
  Administered 2020-12-02: .3 mL

## 2020-12-02 MED ORDER — BUPIVACAINE HCL 0.5 % IJ SOLN
0.3300 mL | INTRAMUSCULAR | Status: AC | PRN
  Administered 2020-12-02: .33 mL

## 2020-12-02 MED ORDER — METHYLPREDNISOLONE ACETATE 40 MG/ML IJ SUSP
13.3300 mg | INTRAMUSCULAR | Status: AC | PRN
Start: 2020-12-02 — End: 2020-12-02
  Administered 2020-12-02: 13.33 mg

## 2020-12-02 NOTE — Progress Notes (Signed)
Office Visit Note   Patient: Brett Arnold           Date of Birth: [birth year]           MRN: [id number] Visit Date: 12/02/2020              Requested by: Sunnie Nielsen, DO [address] [place],  [place] [address] PCP: Sunnie Nielsen, DO   Assessment & Plan: Visit Diagnoses:  1. Pain in right hand     Plan: Impression is right middle finger stenosing tenosynovitis.  We injected this today and will monitor his response to this.  He has been instructed to follow-up in about a month if he does not notice any improvement.  Follow-Up Instructions: Return if symptoms worsen or fail to improve.   Orders:  Orders Placed This Encounter  Procedures  . XR Hand Complete Right   No orders of the defined types were placed in this encounter.     Procedures: Hand/UE Inj: R long A1 for trigger finger on 12/02/2020 11:23 AM Indications: pain Details: 25 G needle Medications: 0.3 mL lidocaine 1 %; 0.33 mL bupivacaine 0.5 %; 13.33 mg methylPREDNISolone acetate 40 MG/ML Outcome: tolerated well, no immediate complications Consent was given by the patient. Patient was prepped and draped in the usual sterile fashion.       Clinical Data: No additional findings.   Subjective: Chief Complaint  Patient presents with  . Right Hand - Pain    Brett Arnold is 65 year old gentleman here for evaluation of right hand pain.  He has had a trigger finger that comes and goes.  He is a Medical illustrator for furniture.  He has trouble making a fist due to the pain.  His pain is mainly localized to the base of the middle finger and the palmar crease.  Denies any numbness and tingling or injuries.  Aleve helps only mildly and partially.   Review of Systems  Constitutional: Negative.   All other systems reviewed and are negative.    Objective: Vital Signs: There were no vitals taken for this visit.  Physical Exam Vitals and nursing note reviewed.  Constitutional:      Appearance: He is  well-developed and well-nourished.  HENT:     Head: Normocephalic and atraumatic.  Eyes:     Pupils: Pupils are equal, round, and reactive to light.  Pulmonary:     Effort: Pulmonary effort is normal.  Abdominal:     Palpations: Abdomen is soft.  Musculoskeletal:        General: Normal range of motion.     Cervical back: Neck supple.  Skin:    General: Skin is warm.  Neurological:     Mental Status: He is alert and oriented to person, place, and time.  Psychiatric:        Mood and Affect: Mood and affect normal.        Behavior: Behavior normal.        Thought Content: Thought content normal.        Judgment: Judgment normal.     Ortho Exam Right hand shows tenderness at the metacarpal head of the middle finger.  There is no comparable masses.  No obvious triggering.  No neurovascular compromise. Specialty Comments:  No specialty comments available.  Imaging: XR Hand Complete Right  Result Date: 12/02/2020 No acute or structural abnormalities.    PMFS History: Patient Active Problem List   Diagnosis Date Noted  . BMI 31.0-31.9,adult   .  Diabetes (HCC)   . Hypertension   . Hyperlipemia   . Pneumonia due to COVID-19 virus 10/17/2020   Past Medical History:  Diagnosis Date  . BMI 31.0-31.9,adult   . Diabetes (HCC)   . Hyperlipemia   . Hypertension     Family History  Problem Relation Age of Onset  . Emphysema Mother   . Lung disease Mother        lung cancer fluid  . Hypertension Father   . Stroke Father   . Heart failure Brother     History reviewed. No pertinent surgical history. Social History   Occupational History  . Not on file  Tobacco Use  . Smoking status: Former Games developer  . Smokeless tobacco: Never Used  Vaping Use  . Vaping Use: Never used  Substance and Sexual Activity  . Alcohol use: Yes    Comment: 2x month  . Drug use: Not Currently  . Sexual activity: Not Currently

## 2020-12-08 ENCOUNTER — Encounter: Payer: Self-pay | Admitting: Orthopaedic Surgery

## 2020-12-08 NOTE — Telephone Encounter (Signed)
Xu, i'm assuming you told him to wear this just at night?  What do you think about wearing longer?

## 2020-12-09 NOTE — Telephone Encounter (Signed)
Yes keep wearing it up to a month if needed.  Follow up if not better.  We can also call in dose pak as well if he wants.  Thanks.

## 2020-12-11 NOTE — Telephone Encounter (Signed)
Can you pass this message along to the patient please and let me know if would like a dose pak?

## 2020-12-12 ENCOUNTER — Other Ambulatory Visit: Payer: Self-pay | Admitting: Physician Assistant

## 2020-12-12 MED ORDER — PREDNISONE 10 MG (21) PO TBPK: ORAL_TABLET | ORAL | 0 refills | Status: DC

## 2020-12-12 NOTE — Telephone Encounter (Signed)
Sent in

## 2021-01-06 NOTE — Telephone Encounter (Signed)
Will need to come in to be reevaluated

## 2021-01-13 ENCOUNTER — Encounter: Payer: Self-pay | Admitting: Orthopaedic Surgery

## 2021-01-13 ENCOUNTER — Ambulatory Visit: Payer: 59 | Admitting: Orthopaedic Surgery

## 2021-01-13 DIAGNOSIS — M79641 Pain in right hand: Secondary | ICD-10-CM

## 2021-01-13 MED ORDER — FEBUXOSTAT 40 MG PO TABS: 40.0000 mg | ORAL_TABLET | Freq: Every day | ORAL | 0 refills | Status: DC

## 2021-01-13 NOTE — Progress Notes (Signed)
Office Visit Note   Patient: Brett Arnold           Date of Birth: April 14, 1956           MRN: [id number] Visit Date: 01/13/2021              Requested by: Sunnie Nielsen, DO [address] [place],  [place] [address] PCP: Sunnie Nielsen, DO   Assessment & Plan: Visit Diagnoses:  1. Pain in right hand     Plan: Impression is right long finger swelling pain.  I suspect there is some inflammatory process going on.  My thoughts are that this either gout or osteoarthritis.  I think it is not likely that he has trigger finger.  We will obtain arthritis panel today and I have sent a prescription for Uloric for him to try.  He will follow-up if he does not notice any improvement.  We will be in touch with him about the results of the arthritis panel.  Follow-Up Instructions: No follow-ups on file.   Orders:  No orders of the defined types were placed in this encounter.  Meds ordered this encounter  Medications  . febuxostat (ULORIC) 40 MG tablet    Sig: Take 1 tablet (40 mg total) by mouth daily.    Dispense:  30 tablet    Refill:  0      Procedures: No procedures performed   Clinical Data: No additional findings.   Subjective: Chief Complaint  Patient presents with  . Right Hand - Pain    Kelen returns today for follow-up of right long finger pain and swelling.  Injection into the flexor tendon on January 28 only helped for couple days.  Denies any triggering.  He has been wearing AlumaFoam splint to help with the discomfort.  He states that it swells up throughout the day and worse after hikes.  Denies any gout.  He states that the entire long finger is swollen.  Aleve only helps minimally.   Review of Systems  Constitutional: Negative.   All other systems reviewed and are negative.    Objective: Vital Signs: There were no vitals taken for this visit.  Physical Exam Vitals and nursing note reviewed.  Constitutional:      Appearance: He is  well-developed.  Pulmonary:     Effort: Pulmonary effort is normal.  Abdominal:     Palpations: Abdomen is soft.  Skin:    General: Skin is warm.  Neurological:     Mental Status: He is alert and oriented to person, place, and time.  Psychiatric:        Behavior: Behavior normal.        Thought Content: Thought content normal.        Judgment: Judgment normal.     Ortho Exam Right long finger shows decreased arc of motion.  Centimeter and half between the tip of the finger to the palmar crease.  No neurovascular compromise.  Overall the finger is mildly to moderately swollen.  There is no triggering. Specialty Comments:  No specialty comments available.  Imaging: No results found.   PMFS History: Patient Active Problem List   Diagnosis Date Noted  . BMI 31.0-31.9,adult   . Diabetes (HCC)   . Hypertension   . Hyperlipemia   . Pneumonia due to COVID-19 virus 10/17/2020   Past Medical History:  Diagnosis Date  . BMI 31.0-31.9,adult   . Diabetes (HCC)   . Hyperlipemia   . Hypertension  Family History  Problem Relation Age of Onset  . Emphysema Mother   . Lung disease Mother        lung cancer fluid  . Hypertension Father   . Stroke Father   . Heart failure Brother     History reviewed. No pertinent surgical history. Social History   Occupational History  . Not on file  Tobacco Use  . Smoking status: Former Games developer  . Smokeless tobacco: Never Used  Vaping Use  . Vaping Use: Never used  Substance and Sexual Activity  . Alcohol use: Yes    Comment: 2x month  . Drug use: Not Currently  . Sexual activity: Not Currently

## 2021-01-13 NOTE — Addendum Note (Signed)
Addended by: Albertina Parr on: 01/13/2021 08:56 AM   Modules accepted: Orders

## 2021-01-16 LAB — SEDIMENTATION RATE: Sed Rate: 2 mm/h (ref 0–20)

## 2021-01-16 LAB — URIC ACID: Uric Acid, Serum: 6.3 mg/dL (ref 4.0–8.0)

## 2021-01-16 LAB — RHEUMATOID FACTOR: Rheumatoid fact SerPl-aCnc: <14 IU/mL (ref <14)

## 2021-01-16 LAB — ANA: Anti Nuclear Antibody (ANA): NEGATIVE

## 2021-01-19 ENCOUNTER — Encounter: Payer: Self-pay | Admitting: Orthopaedic Surgery

## 2021-01-19 NOTE — Telephone Encounter (Signed)
Labs looked for inflammatory markers that could suggest autoimmune disease.  All labs were normal

## 2021-01-23 NOTE — Telephone Encounter (Signed)
Xu, Want me to have him come back in and see you?

## 2021-01-25 ENCOUNTER — Other Ambulatory Visit: Payer: Self-pay | Admitting: Orthopaedic Surgery

## 2021-01-25 MED ORDER — PREDNISONE 10 MG (21) PO TBPK: ORAL_TABLET | ORAL | 0 refills | Status: DC

## 2021-02-13 ENCOUNTER — Other Ambulatory Visit: Payer: Self-pay | Admitting: Orthopaedic Surgery

## 2021-02-13 MED ORDER — DICLOFENAC SODIUM 75 MG PO TBEC: 75.0000 mg | DELAYED_RELEASE_TABLET | Freq: Two times a day (BID) | ORAL | 2 refills | Status: DC

## 2021-04-01 ENCOUNTER — Other Ambulatory Visit: Payer: Self-pay | Admitting: Orthopaedic Surgery

## 2021-04-27 ENCOUNTER — Encounter: Payer: Self-pay | Admitting: Osteopathic Medicine

## 2021-04-27 ENCOUNTER — Other Ambulatory Visit: Payer: Self-pay

## 2021-04-27 ENCOUNTER — Ambulatory Visit (INDEPENDENT_AMBULATORY_CARE_PROVIDER_SITE_OTHER): Payer: 59 | Admitting: Osteopathic Medicine

## 2021-04-27 VITALS — BP 135/78 | HR 61 | Temp 97.1°F | Wt 213.1 lb

## 2021-04-27 DIAGNOSIS — M9901 Segmental and somatic dysfunction of cervical region: Secondary | ICD-10-CM | POA: Diagnosis not present

## 2021-04-27 DIAGNOSIS — E785 Hyperlipidemia, unspecified: Secondary | ICD-10-CM

## 2021-04-27 DIAGNOSIS — E1169 Type 2 diabetes mellitus with other specified complication: Secondary | ICD-10-CM | POA: Diagnosis not present

## 2021-04-27 DIAGNOSIS — Z125 Encounter for screening for malignant neoplasm of prostate: Secondary | ICD-10-CM

## 2021-04-27 DIAGNOSIS — E1159 Type 2 diabetes mellitus with other circulatory complications: Secondary | ICD-10-CM

## 2021-04-27 DIAGNOSIS — I152 Hypertension secondary to endocrine disorders: Secondary | ICD-10-CM

## 2021-04-27 DIAGNOSIS — Z0000 Encounter for general adult medical examination without abnormal findings: Secondary | ICD-10-CM

## 2021-04-27 DIAGNOSIS — E119 Type 2 diabetes mellitus without complications: Secondary | ICD-10-CM | POA: Diagnosis not present

## 2021-04-27 DIAGNOSIS — M542 Cervicalgia: Secondary | ICD-10-CM

## 2021-04-27 DIAGNOSIS — Z87891 Personal history of nicotine dependence: Secondary | ICD-10-CM

## 2021-04-27 MED ORDER — LORATADINE 10 MG PO TABS: 10.0000 mg | ORAL_TABLET | Freq: Every day | ORAL | 3 refills | Status: AC

## 2021-04-27 MED ORDER — LOSARTAN POTASSIUM 25 MG PO TABS: 25.0000 mg | ORAL_TABLET | Freq: Every day | ORAL | 3 refills | Status: DC

## 2021-04-27 MED ORDER — ATORVASTATIN CALCIUM 80 MG PO TABS: 80.0000 mg | ORAL_TABLET | Freq: Every day | ORAL | 3 refills | Status: DC

## 2021-04-27 MED ORDER — METFORMIN HCL ER 500 MG PO TB24: 500.0000 mg | ORAL_TABLET | Freq: Every day | ORAL | 3 refills | Status: DC

## 2021-04-27 MED ORDER — LORATADINE 10 MG PO TABS: 10.0000 mg | ORAL_TABLET | Freq: Every day | ORAL | 3 refills | Status: DC

## 2021-04-27 NOTE — Progress Notes (Signed)
Brett Arnold is a 65 y.o. male who presents to  [facility]  today, 04/27/21, seeking care for the following:  Annual physical  Neck pain - requests OMT today      ASSESSMENT & PLAN with other pertinent findings:  The primary encounter diagnosis was Annual physical exam. Diagnoses of Hypertension associated with diabetes (HCC), Controlled type 2 diabetes mellitus without complication, without long-term current use of insulin (HCC), Hyperlipidemia associated with type 2 diabetes mellitus (HCC), Former smoker, Prostate cancer screening, Neck pain, and Somatic dysfunction of spine, cervical were also pertinent to this visit.   OMT: MFR and FPR to cervical spine region to (+)patient relief  Patient Instructions    Orders Placed This Encounter  Procedures   CBC   COMPLETE METABOLIC PANEL WITH GFR   Lipid panel   Hemoglobin A1c   PSA, Total with Reflex to PSA, Free   Uric acid    Meds ordered this encounter  Medications   metFORMIN (GLUCOPHAGE XR) 500 MG 24 hr tablet    Sig: Take 1 tablet (500 mg total) by mouth daily with breakfast.    Dispense:  90 tablet    Refill:  3   losartan (COZAAR) 25 MG tablet    Sig: Take 1 tablet (25 mg total) by mouth daily.    Dispense:  90 tablet    Refill:  3   loratadine (CLARITIN) 10 MG tablet    Sig: Take 1 tablet (10 mg total) by mouth daily.    Dispense:  90 tablet    Refill:  3   atorvastatin (LIPITOR) 80 MG tablet    Sig: Take 1 tablet (80 mg total) by mouth daily.    Dispense:  90 tablet    Refill:  3   General Preventive Care Most recent routine screening labs: ordered today.  Blood pressure goal 130/80 or less.  Tobacco: don't!  Alcohol: responsible moderation is ok for most adults - if you have concerns about your alcohol intake, please talk to me!  Exercise: as tolerated to reduce risk of cardiovascular disease and diabetes. Strength training will also prevent  osteoporosis.  Mental health: if need for mental health care (medicines, counseling, other), or concerns about moods, please let me know!  Sexual / Reproductive health: if need for STD testing, or if concerns with libido/pain problems, please let me know!  Advanced Directive: Living Will and/or Healthcare Power of Attorney recommended for all adults, regardless of age or health.  Vaccines Flu vaccine: for almost everyone, every fall.  Shingles vaccine: after age 72.  Pneumonia vaccines: once for diabetics recommended, booster after age 36 Tetanus booster: every 10 years  COVID vaccine: STRONGLY RECOMMENDED Cancer screenings  Colon cancer screening: for everyone age 110-75. Colonoscopy available for all, many people also qualify for the Cologuard stool test  Prostate cancer screening: PSA blood test age 75-71 Lung cancer screening: CT chest every year for those aged 55 to 61 years who have a 20 pack-year smoking history and currently smoke or have quit within the past 15 years Infection screenings  HIV: recommended screening at least once age 97-65, more often as needed. Gonorrhea/Chlamydia: screening as needed, though many insurances require testing for anyone on birth control pills. Hepatitis C: recommended once for everyone age 48-75 TB: certain at-risk populations, or depending on work requirements and/or travel history Other Bone Density Test: recommended for women at age 35, men at age 40, sooner depending on  risk factors Abdominal Aortic Aneurysm: screening with ultrasound recommended once for men age 64-75 who have ever smoked    See below for relevant physical exam findings  See below for recent lab and imaging results reviewed  Medications, allergies, PMH, PSH, SocH, FamH reviewed below    Follow-up instructions: Return in about 1 year (around 04/27/2022) for [place] (call week prior to visit for lab  orders).                                        Exam:  BP 135/78 (BP Location: Left Arm, Patient Position: Sitting, Cuff Size: Normal)   Pulse 61   Temp (!) 97.1 F (36.2 C) (Oral)   Wt 213 lb 1.9 oz (96.7 kg)   BMI 30.58 kg/m  Constitutional: VS see above. General Appearance: alert, well-developed, well-nourished, NAD Neck: No masses, trachea midline.  Respiratory: Normal respiratory effort. no wheeze, no rhonchi, no rales Cardiovascular: S1/S2 normal, no murmur, no rub/gallop auscultated. RRR.  Musculoskeletal: Gait normal. Symmetric and independent movement of all extremities Abdominal: non-tender, non-distended, no appreciable organomegaly, neg Murphy's, BS WNLx4 Neurological: Normal balance/coordination. No tremor. Skin: warm, dry, intact.  Psychiatric: Normal judgment/insight. Normal mood and affect. Oriented x3.   Current Meds  Medication Sig   AMBULATORY NON FORMULARY MEDICATION Supply ordered: CPAP and other supplies needed (headgear, cushions, filters, heated tuubing and water chamber) Dx: obstructive sleep apnea Settings: auto-titration   atorvastatin (LIPITOR) 80 MG tablet Take 1 tablet (80 mg total) by mouth daily.   febuxostat (ULORIC) 40 MG tablet Take 1 tablet (40 mg total) by mouth daily.   loratadine (CLARITIN) 10 MG tablet Take 1 tablet (10 mg total) by mouth daily.   losartan (COZAAR) 25 MG tablet Take 1 tablet (25 mg total) by mouth daily.   metFORMIN (GLUCOPHAGE XR) 500 MG 24 hr tablet Take 1 tablet (500 mg total) by mouth daily with breakfast.    No Known Allergies  Patient Active Problem List   Diagnosis Date Noted   BMI 31.0-31.9,adult    Diabetes (HCC)    Hypertension    Hyperlipemia    Pneumonia due to COVID-19 virus 10/17/2020    Family History  Problem Relation Age of Onset   Emphysema Mother    Lung disease Mother        lung cancer fluid   Hypertension Father    Stroke Father    Heart failure  Brother     Social History   Tobacco Use  Smoking Status Former   Pack years: 0.00  Smokeless Tobacco Never    No past surgical history on file.  Immunization History  Administered Date(s) Administered   Influenza-Unspecified 11/05/2017    No results found for this or any previous visit (from the past 2160 hour(s)).  No results found.     All questions at time of visit were answered - patient instructed to contact office with any additional concerns or updates. ER/RTC precautions were reviewed with the patient as applicable.   Please note: manual typing as well as voice recognition software may have been used to produce this document - typos may escape review. Please contact Dr. Lyn Hollingshead for any needed clarifications.

## 2021-04-28 LAB — LIPID PANEL
Cholesterol: 198 mg/dL (ref <200)
HDL: 41 mg/dL (ref >=40)
LDL Cholesterol (Calc): 134 mg/dL (calc) — ABNORMAL HIGH
Non-HDL Cholesterol (Calc): 157 mg/dL (calc) — ABNORMAL HIGH (ref <130)
Total CHOL/HDL Ratio: 4.8 (calc) (ref <5.0)
Triglycerides: 120 mg/dL (ref <150)

## 2021-04-28 LAB — COMPLETE METABOLIC PANEL WITH GFR
AG Ratio: 2.1 (calc) (ref 1.0–2.5)
ALT: 36 U/L (ref 9–46)
AST: 15 U/L (ref 10–35)
Albumin: 4.2 g/dL (ref 3.6–5.1)
Alkaline phosphatase (APISO): 70 U/L (ref 35–144)
BUN: 20 mg/dL (ref 7–25)
CO2: 24 mmol/L (ref 20–32)
Calcium: 9.2 mg/dL (ref 8.6–10.3)
Chloride: 110 mmol/L (ref 98–110)
Creat: 0.76 mg/dL (ref 0.70–1.25)
GFR, Est African American: 113 mL/min/{1.73_m2} (ref >=60)
GFR, Est Non African American: 97 mL/min/{1.73_m2} (ref >=60)
Globulin: 2 g/dL (calc) (ref 1.9–3.7)
Glucose, Bld: 126 mg/dL — ABNORMAL HIGH (ref 65–99)
Potassium: 4.2 mmol/L (ref 3.5–5.3)
Sodium: 141 mmol/L (ref 135–146)
Total Bilirubin: 0.5 mg/dL (ref 0.2–1.2)
Total Protein: 6.2 g/dL (ref 6.1–8.1)

## 2021-04-28 LAB — CBC
HCT: 44.8 % (ref 38.5–50.0)
Hemoglobin: 14.9 g/dL (ref 13.2–17.1)
MCH: 29.6 pg (ref 27.0–33.0)
MCHC: 33.3 g/dL (ref 32.0–36.0)
MCV: 89.1 fL (ref 80.0–100.0)
MPV: 11.1 fL (ref 7.5–12.5)
Platelets: 269 10*3/uL (ref 140–400)
RBC: 5.03 10*6/uL (ref 4.20–5.80)
RDW: 12.2 % (ref 11.0–15.0)
WBC: 6.3 10*3/uL (ref 3.8–10.8)

## 2021-04-28 LAB — HEMOGLOBIN A1C
Hgb A1c MFr Bld: 6.2 % of total Hgb — ABNORMAL HIGH (ref <5.7)
Mean Plasma Glucose: 131 mg/dL
eAG (mmol/L): 7.3 mmol/L

## 2021-04-28 LAB — URIC ACID: Uric Acid, Serum: 5.3 mg/dL (ref 4.0–8.0)

## 2021-04-28 LAB — PSA, TOTAL WITH REFLEX TO PSA, FREE: PSA, Total: 2.7 ng/mL (ref <=4.0)

## 2021-05-25 ENCOUNTER — Other Ambulatory Visit: Payer: Self-pay | Admitting: Physician Assistant

## 2021-06-09 ENCOUNTER — Other Ambulatory Visit: Payer: Self-pay | Admitting: Physician Assistant

## 2021-07-06 ENCOUNTER — Ambulatory Visit: Payer: 59 | Admitting: Orthopaedic Surgery

## 2021-08-25 ENCOUNTER — Emergency Department
Admission: EM | Admit: 2021-08-25 | Discharge: 2021-08-25 | Disposition: A | Discharge status: Home / Self Care | Payer: 59 | Source: Home / Self Care

## 2021-08-25 ENCOUNTER — Other Ambulatory Visit: Payer: Self-pay

## 2021-08-25 DIAGNOSIS — J309 Allergic rhinitis, unspecified: Secondary | ICD-10-CM | POA: Diagnosis not present

## 2021-08-25 DIAGNOSIS — J0100 Acute maxillary sinusitis, unspecified: Secondary | ICD-10-CM | POA: Diagnosis not present

## 2021-08-25 MED ORDER — FEXOFENADINE HCL 180 MG PO TABS: 180.0000 mg | ORAL_TABLET | Freq: Every day | ORAL | 0 refills | Status: DC

## 2021-08-25 MED ORDER — AMOXICILLIN-POT CLAVULANATE 875-125 MG PO TABS: 1.0000 | ORAL_TABLET | Freq: Two times a day (BID) | ORAL | 0 refills | Status: AC

## 2021-08-25 NOTE — ED Triage Notes (Signed)
Pt presents to Urgent Care with c/o cough, nasal congestion, and sore throat x 4 days. Reports negative home COVID test; unvaccinated.

## 2021-08-25 NOTE — Discharge Instructions (Addendum)
Advised patient to take medication as directed with food to completion.  Instructed patient to discontinue OTC Claritin and Coracidin NOW. Encouraged patient to take Allegra with first dose of antibiotic for the next 7 of 10 days, then as needed for concurrent postnasal drainage/drip.  Encouraged patient increase daily water intake while taking these medications.

## 2021-08-25 NOTE — ED Provider Notes (Signed)
[facility]    CSN: [id number] Arrival date & time: 08/25/21  0845      History   Chief Complaint Chief Complaint  Patient presents with   Cough   Sore Throat    HPI Brett Arnold is a 65 y.o. male.   HPI 65 year old male presents with cough, nasal congestion and sore throat for 4 days.  Reports negative home COVID-19 test.  Patient is unvaccinated for COVID-19.  Past Medical History:  Diagnosis Date   BMI 31.0-31.9,adult    Diabetes (HCC)    Hyperlipemia    Hypertension     Patient Active Problem List   Diagnosis Date Noted   BMI 31.0-31.9,adult    Diabetes (HCC)    Hypertension    Hyperlipemia    Pneumonia due to COVID-19 virus 10/17/2020    History reviewed. No pertinent surgical history.     Home Medications    Prior to Admission medications   Medication Sig Start Date End Date Taking? Authorizing Provider  amoxicillin-clavulanate (AUGMENTIN) 875-125 MG tablet Take 1 tablet by mouth every 12 (twelve) hours for 10 days. 08/25/21 09/04/21 Yes Trevor Iha, FNP  Chlorphen-PE-Acetaminophen (CORICIDIN D COLD/FLU/SINUS PO) Take by mouth.   Yes [provider]  fexofenadine (ALLEGRA ALLERGY) 180 MG tablet Take 1 tablet (180 mg total) by mouth daily for 15 days. 08/25/21 09/09/21 Yes Trevor Iha, FNP  AMBULATORY NON FORMULARY MEDICATION Supply ordered: CPAP and other supplies needed (headgear, cushions, filters, heated tuubing and water chamber) Dx: obstructive sleep apnea Settings: auto-titration 09/01/20   Sunnie Nielsen, DO  atorvastatin (LIPITOR) 80 MG tablet Take 1 tablet (80 mg total) by mouth daily. 04/27/21   Sunnie Nielsen, DO  diclofenac (VOLTAREN) 75 MG EC tablet TAKE 1 TABLET(75 MG) BY MOUTH TWICE DAILY Patient not taking: No sig reported 04/04/21   Cristie Hem, PA-C  febuxostat (ULORIC) 40 MG tablet Take 1 tablet (40 mg total) by mouth daily. 01/13/21   Tarry Kos, MD  loratadine (CLARITIN) 10 MG tablet Take 1  tablet (10 mg total) by mouth daily. 04/27/21   Sunnie Nielsen, DO  losartan (COZAAR) 25 MG tablet Take 1 tablet (25 mg total) by mouth daily. 04/27/21   Sunnie Nielsen, DO  metFORMIN (GLUCOPHAGE XR) 500 MG 24 hr tablet Take 1 tablet (500 mg total) by mouth daily with breakfast. 04/27/21 04/27/22  Sunnie Nielsen, DO    Family History Family History  Problem Relation Age of Onset   Emphysema Mother    Lung disease Mother        lung cancer fluid   Hypertension Father    Stroke Father    Heart failure Brother     Social History Social History   Tobacco Use   Smoking status: Former    Years: 35.00    Types: Cigarettes    Quit date: 08/25/2017    Years since quitting: 4.0   Smokeless tobacco: Never  Vaping Use   Vaping Use: Never used  Substance Use Topics   Alcohol use: Yes    Comment: occasionally   Drug use: Not Currently     Allergies   Patient has no known allergies.   Review of Systems Review of Systems  HENT:  Positive for congestion and postnasal drip.   Respiratory:  Positive for cough.   All other systems reviewed and are negative.   Physical Exam Triage Vital Signs ED Triage Vitals  Enc Vitals Group     BP 08/25/21 0904 (!) 156/90  Pulse Rate 08/25/21 0904 79     Resp 08/25/21 0904 18     Temp 08/25/21 0904 97.7 F (36.5 C)     Temp Source 08/25/21 0904 Oral     SpO2 08/25/21 0904 96 %     Weight 08/25/21 0858 212 lb (96.2 kg)     Height 08/25/21 0858 5\' 10"  (1.778 m)     Head Circumference --      Peak Flow --      Pain Score 08/25/21 0858 3     Pain Loc --      Pain Edu? --      Excl. in GC? --    No data found.  Updated Vital Signs BP (!) 156/90 (BP Location: Right Arm)   Pulse 79   Temp 97.7 F (36.5 C) (Oral)   Resp 18   Ht 5\' 10"  (1.778 m)   Wt 212 lb (96.2 kg)   SpO2 96%   BMI 30.42 kg/m    Physical Exam Vitals and nursing note reviewed.  Constitutional:      General: He is not in acute distress.     Appearance: He is obese. He is not ill-appearing.  HENT:     Head: Normocephalic and atraumatic.     Right Ear: Tympanic membrane, ear canal and external ear normal.     Left Ear: Tympanic membrane, ear canal and external ear normal.     Mouth/Throat:     Mouth: Mucous membranes are moist.     Pharynx: Oropharynx is clear.  Eyes:     Extraocular Movements: Extraocular movements intact.     Conjunctiva/sclera: Conjunctivae normal.     Pupils: Pupils are equal, round, and reactive to light.  Cardiovascular:     Rate and Rhythm: Normal rate and regular rhythm.     Pulses: Normal pulses.     Heart sounds: Normal heart sounds.  Pulmonary:     Effort: Pulmonary effort is normal.     Breath sounds: Normal breath sounds. No wheezing, rhonchi or rales.  Musculoskeletal:        General: Normal range of motion.     Cervical back: Normal range of motion and neck supple.  Skin:    General: Skin is warm and dry.  Neurological:     General: No focal deficit present.     Mental Status: He is alert and oriented to person, place, and time. Mental status is at baseline.     UC Treatments / Results  Labs (all labs ordered are listed, but only abnormal results are displayed) Labs Reviewed - No data to display  EKG   Radiology No results found.  Procedures Procedures (including critical care time)  Medications Ordered in UC Medications - No data to display  Initial Impression / Assessment and Plan / UC Course  I have reviewed the triage vital signs and the nursing notes.  Pertinent labs & imaging results that were available during my care of the patient were reviewed by me and considered in my medical decision making (see chart for details).     MDM: 1.  Subacute maxillary sinusitis-Rx'd Augmentin; 2.  Allergic rhinitis-Rx'd Allegra. Advised patient to take medication as directed with food to completion.  Instructed patient to discontinue OTC Claritin and Coracidin NOW. Encouraged  patient to take Allegra with first dose of antibiotic for the next 7 of 10 days, then as needed for concurrent postnasal drainage/drip.  Encouraged patient increase daily water intake while taking these medications.  Patient discharged home, hemodynamically stable. Final Clinical Impressions(s) / UC Diagnoses   Final diagnoses:  Subacute maxillary sinusitis  Allergic rhinitis, unspecified seasonality, unspecified trigger     Discharge Instructions      Advised patient to take medication as directed with food to completion.  Instructed patient to discontinue OTC Claritin and Coracidin NOW. Encouraged patient to take Allegra with first dose of antibiotic for the next 7 of 10 days, then as needed for concurrent postnasal drainage/drip.  Encouraged patient increase daily water intake while taking these medications.     ED Prescriptions     Medication Sig Dispense Auth. Provider   amoxicillin-clavulanate (AUGMENTIN) 875-125 MG tablet Take 1 tablet by mouth every 12 (twelve) hours for 10 days. 20 tablet Trevor Iha, FNP   fexofenadine [facility] ALLERGY) 180 MG tablet Take 1 tablet (180 mg total) by mouth daily for 15 days. 15 tablet Trevor Iha, FNP      PDMP not reviewed this encounter.   Trevor Iha, FNP 08/25/21 1007

## 2021-09-15 ENCOUNTER — Ambulatory Visit (INDEPENDENT_AMBULATORY_CARE_PROVIDER_SITE_OTHER): Payer: 59 | Admitting: Family Medicine

## 2021-09-15 ENCOUNTER — Encounter: Payer: Self-pay | Admitting: Family Medicine

## 2021-09-15 ENCOUNTER — Other Ambulatory Visit: Payer: Self-pay

## 2021-09-15 VITALS — BP 134/88 | HR 69 | Temp 97.9°F | Wt 218.1 lb

## 2021-09-15 DIAGNOSIS — Z298 Encounter for other specified prophylactic measures: Secondary | ICD-10-CM

## 2021-09-15 MED ORDER — AMOXICILLIN 500 MG PO CAPS: 2000.0000 mg | ORAL_CAPSULE | Freq: Once | ORAL | 0 refills | Status: AC

## 2021-09-15 NOTE — Progress Notes (Signed)
Acute Office Visit  Subjective:    Patient ID: Brett Arnold, male    DOB: 29-Jul-1956, 65 y.o.   MRN: [id number]  Chief Complaint  Patient presents with   Antibiotics    Dental work    HPI Patient is in today requesting antibiotics prior to dental procedure.   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 He is scheduled for dental work next Thursday. Has tolerated prophylactic dose for amoxicillin well. No current complaints or concerns.     Past Medical History:  Diagnosis Date   BMI 31.0-31.9,adult    Diabetes (HCC)    Hyperlipemia    Hypertension     No past surgical history on file.  Family History  Problem Relation Age of Onset   Emphysema Mother    Lung disease Mother        lung cancer fluid   Hypertension Father    Stroke Father    Heart failure Brother     Social History   Socioeconomic History   Marital status: Single    Spouse name: Not on file   Number of children: Not on file   Years of education: Not on file   Highest education level: Not on file  Occupational History   Not on file  Tobacco Use   Smoking status: Former    Years: 35.00    Types: Cigarettes    Quit date: 08/25/2017    Years since quitting: 4.0   Smokeless tobacco: Never  Vaping Use   Vaping Use: Never used  Substance and Sexual Activity   Alcohol use: Yes    Comment: occasionally   Drug use: Not Currently   Sexual activity: Not Currently  Other Topics Concern   Not on file  Social History Narrative   Not on file   Social Determinants of Health   Financial Resource Strain: Not on file  Food Insecurity: Not on file  Transportation Needs: Not on file  Physical Activity: Not on file  Stress: Not on file  Social Connections: Not on file  Intimate Partner Violence: Not on file    Outpatient Medications Prior to Visit  Medication Sig Dispense Refill   AMBULATORY NON FORMULARY  MEDICATION Supply ordered: CPAP and other supplies needed (headgear, cushions, filters, heated tuubing and water chamber) Dx: obstructive sleep apnea Settings: auto-titration 1 Units 99   atorvastatin (LIPITOR) 80 MG tablet Take 1 tablet (80 mg total) by mouth daily. 90 tablet 3   Chlorphen-PE-Acetaminophen (CORICIDIN D COLD/FLU/SINUS PO) Take by mouth.     febuxostat (ULORIC) 40 MG tablet Take 1 tablet (40 mg total) by mouth daily. 30 tablet 0   loratadine (CLARITIN) 10 MG tablet Take 1 tablet (10 mg total) by mouth daily. 90 tablet 3   losartan (COZAAR) 25 MG tablet Take 1 tablet (25 mg total) by mouth daily. 90 tablet 3   metFORMIN (GLUCOPHAGE XR) 500 MG 24 hr tablet Take 1 tablet (500 mg total) by mouth daily with breakfast. 90 tablet 3   diclofenac (VOLTAREN) 75 MG EC tablet TAKE 1 TABLET(75 MG) BY MOUTH TWICE DAILY (Patient not taking: No sig reported) 30 tablet 2   fexofenadine (ALLEGRA ALLERGY) 180 MG tablet Take 1 tablet (180 mg total) by mouth daily for 15 days. 15 tablet 0   No facility-administered medications prior to visit.    No Known Allergies  Review of Systems All review of  systems negative except what is listed in the HPI     Objective:    Physical Exam Vitals reviewed.  Constitutional:      Appearance: Normal appearance.  HENT:     Head: Normocephalic and atraumatic.  Cardiovascular:     Rate and Rhythm: Normal rate and regular rhythm.     Heart sounds: Normal heart sounds. No murmur heard. Pulmonary:     Effort: Pulmonary effort is normal.     Breath sounds: Normal breath sounds.  Skin:    General: Skin is warm and dry.  Neurological:     Mental Status: He is alert and oriented to person, place, and time.  Psychiatric:        Mood and Affect: Mood normal.        Behavior: Behavior normal.        Thought Content: Thought content normal.        Judgment: Judgment normal.      BP 134/88 (BP Location: Left Arm, Patient Position: Sitting, Cuff Size:  Normal)   Pulse 69   Temp 97.9 F (36.6 C) (Oral)   Wt 218 lb 1.9 oz (98.9 kg)   SpO2 95%   BMI 31.30 kg/m  Wt Readings from Last 3 Encounters:  09/15/21 218 lb 1.9 oz (98.9 kg)  08/25/21 212 lb (96.2 kg)  04/27/21 213 lb 1.9 oz (96.7 kg)    Health Maintenance Due  Topic Date Due   Pneumococcal Vaccine 35-90 Years old (1 - PCV) Never done   FOOT EXAM  Never done   OPHTHALMOLOGY EXAM  Never done   HIV Screening  Never done   Hepatitis C Screening  Never done   COLONOSCOPY (Pts 45-7yrs Insurance coverage will need to be confirmed)  Never done    There are no preventive care reminders to display for this patient.   No results found for: TSH Lab Results  Component Value Date   WBC 6.3 04/27/2021   HGB 14.9 04/27/2021   HCT 44.8 04/27/2021   MCV 89.1 04/27/2021   PLT 269 04/27/2021   Lab Results  Component Value Date   NA 141 04/27/2021   K 4.2 04/27/2021   CO2 24 04/27/2021   GLUCOSE 126 (H) 04/27/2021   BUN 20 04/27/2021   CREATININE 0.76 04/27/2021   BILITOT 0.5 04/27/2021   AST 15 04/27/2021   ALT 36 04/27/2021   PROT 6.2 04/27/2021   CALCIUM 9.2 04/27/2021   Lab Results  Component Value Date   CHOL 198 04/27/2021   Lab Results  Component Value Date   HDL 41 04/27/2021   Lab Results  Component Value Date   LDLCALC 134 (H) 04/27/2021   Lab Results  Component Value Date   TRIG 120 04/27/2021   Lab Results  Component Value Date   CHOLHDL 4.8 04/27/2021   Lab Results  Component Value Date   HGBA1C 6.2 (H) 04/27/2021       Assessment & Plan:   1. Indication present for endocarditis prophylaxis Discussed with patient. Sending in prophylactic dose of amoxicillin for one time use prior to dental work. Patient agreeable. Patient aware of signs/symptoms requiring further/urgent evaluation.   - amoxicillin (AMOXIL) 500 MG capsule; Take 4 capsules (2,000 mg total) by mouth once for 1 dose. Take 30-60 minutes before dental procedure  Dispense: 4  capsule; Refill: 0   Follow-up as needed.   Lollie Marrow Reola Calkins, DNP, FNP-C

## 2021-11-02 LAB — HM DIABETES EYE EXAM

## 2021-11-07 ENCOUNTER — Emergency Department
Admission: EM | Admit: 2021-11-07 | Discharge: 2021-11-07 | Disposition: A | Discharge status: Home / Self Care | Payer: 59 | Source: Home / Self Care | Attending: Family Medicine | Admitting: Family Medicine

## 2021-11-07 ENCOUNTER — Other Ambulatory Visit: Payer: Self-pay

## 2021-11-07 ENCOUNTER — Encounter: Payer: Self-pay | Admitting: Emergency Medicine

## 2021-11-07 DIAGNOSIS — U071 COVID-19: Secondary | ICD-10-CM

## 2021-11-07 DIAGNOSIS — R059 Cough, unspecified: Secondary | ICD-10-CM

## 2021-11-07 LAB — POC SARS CORONAVIRUS 2 AG -  ED: SARS Coronavirus 2 Ag: POSITIVE — AB

## 2021-11-07 LAB — POCT INFLUENZA A/B
Influenza A, POC: NEGATIVE
Influenza B, POC: NEGATIVE

## 2021-11-07 MED ORDER — NIRMATRELVIR/RITONAVIR (PAXLOVID)TABLET: 3.0000 | ORAL_TABLET | Freq: Two times a day (BID) | ORAL | 0 refills | Status: AC

## 2021-11-07 NOTE — ED Provider Notes (Signed)
[facility]    CSN: [id number] Arrival date & time: 11/07/21  1810      History   Chief Complaint Chief Complaint  Patient presents with   Nasal Congestion    HPI Ruffin FrederickJames M Starkes is a 66 y.o. male.   HPI  Patient has had nasal congestion cough and sore throat for 4 days.  He is unvaccinated.  He has no known exposure to COVID although his great nephew had a cough when they visited a couple days ago. No shortness of breath No fever  Past Medical History:  Diagnosis Date   BMI 31.0-31.9,adult    Diabetes (HCC)    Hyperlipemia    Hypertension     Patient Active Problem List   Diagnosis Date Noted   BMI 31.0-31.9,adult    Diabetes (HCC)    Hypertension    Hyperlipemia    Pneumonia due to COVID-19 virus 10/17/2020    History reviewed. No pertinent surgical history.     Home Medications    Prior to Admission medications   Medication Sig Start Date End Date Taking? Authorizing Provider  nirmatrelvir/ritonavir EUA (PAXLOVID) 20 x 150 MG & 10 x 100MG  TABS Take 3 tablets by mouth 2 (two) times daily for 5 days. Patient GFR is 67. Take nirmatrelvir (150 mg) two tablets twice daily for 5 days and ritonavir (100 mg) one tablet twice daily for 5 days. 11/07/21 11/12/21 Yes Eustace MooreNelson, Kalaya Infantino Sue, MD  AMBULATORY NON FORMULARY MEDICATION Supply ordered: CPAP and other supplies needed (headgear, cushions, filters, heated tuubing and water chamber) Dx: obstructive sleep apnea Settings: auto-titration 09/01/20   Sunnie NielsenAlexander, Natalie, DO  atorvastatin (LIPITOR) 80 MG tablet Take 1 tablet (80 mg total) by mouth daily. 04/27/21   Sunnie NielsenAlexander, Natalie, DO  Chlorphen-PE-Acetaminophen (CORICIDIN D COLD/FLU/SINUS PO) Take by mouth.    [provider]  diclofenac (VOLTAREN) 75 MG EC tablet TAKE 1 TABLET(75 MG) BY MOUTH TWICE DAILY Patient not taking: No sig reported 04/04/21   Cristie HemStanbery, Mary L, PA-C  febuxostat (ULORIC) 40 MG tablet Take 1 tablet (40 mg total) by mouth daily.  01/13/21   Tarry KosXu, Naiping M, MD  fexofenadine [facility](ALLEGRA ALLERGY) 180 MG tablet Take 1 tablet (180 mg total) by mouth daily for 15 days. 08/25/21 09/09/21  Trevor Ihaagan, Michael, FNP  loratadine (CLARITIN) 10 MG tablet Take 1 tablet (10 mg total) by mouth daily. 04/27/21   Sunnie NielsenAlexander, Natalie, DO  losartan (COZAAR) 25 MG tablet Take 1 tablet (25 mg total) by mouth daily. 04/27/21   Sunnie NielsenAlexander, Natalie, DO  metFORMIN (GLUCOPHAGE XR) 500 MG 24 hr tablet Take 1 tablet (500 mg total) by mouth daily with breakfast. 04/27/21 04/27/22  Sunnie NielsenAlexander, Natalie, DO    Family History Family History  Problem Relation Age of Onset   Emphysema Mother    Lung disease Mother        lung cancer fluid   Hypertension Father    Stroke Father    Heart failure Brother     Social History Social History   Tobacco Use   Smoking status: Former    Years: 35.00    Types: Cigarettes    Quit date: 08/25/2017    Years since quitting: 4.2   Smokeless tobacco: Never  Vaping Use   Vaping Use: Never used  Substance Use Topics   Alcohol use: Yes    Comment: occasionally   Drug use: Not Currently     Allergies   Patient has no known allergies.   Review of Systems  Review of Systems See HPI  Physical Exam Triage Vital Signs ED Triage Vitals  Enc Vitals Group     BP 11/07/21 1909 137/81     Pulse Rate 11/07/21 1909 84     Resp 11/07/21 1909 18     Temp 11/07/21 1909 98.4 F (36.9 C)     Temp Source 11/07/21 1909 Oral     SpO2 11/07/21 1909 97 %     Weight 11/07/21 1910 220 lb (99.8 kg)     Height 11/07/21 1910 5\' 10"  (1.778 m)     Head Circumference --      Peak Flow --      Pain Score 11/07/21 1910 0     Pain Loc --      Pain Edu? --      Excl. in GC? --    No data found.  Updated Vital Signs BP 137/81 (BP Location: Left Arm)    Pulse 84    Temp 98.4 F (36.9 C) (Oral)    Resp 18    Ht 5\' 10"  (1.778 m)    Wt 99.8 kg    SpO2 97%    BMI 31.57 kg/m      Physical Exam Constitutional:      General: He is not  in acute distress.    Appearance: He is well-developed.     Comments: Overweight.  No acute distress  HENT:     Head: Normocephalic and atraumatic.     Mouth/Throat:     Comments: Mask is in place Eyes:     Conjunctiva/sclera: Conjunctivae normal.     Pupils: Pupils are equal, round, and reactive to light.  Cardiovascular:     Rate and Rhythm: Normal rate.  Pulmonary:     Effort: Pulmonary effort is normal. No respiratory distress.  Abdominal:     General: There is no distension.     Palpations: Abdomen is soft.  Musculoskeletal:        General: Normal range of motion.     Cervical back: Normal range of motion.  Skin:    General: Skin is warm and dry.  Neurological:     Mental Status: He is alert.  Psychiatric:        Mood and Affect: Mood normal.        Behavior: Behavior normal.     UC Treatments / Results  Labs (all labs ordered are listed, but only abnormal results are displayed) Labs Reviewed  POC SARS CORONAVIRUS 2 AG -  ED - Abnormal; Notable for the following components:      Result Value   SARS Coronavirus 2 Ag Positive (*)    All other components within normal limits  POCT INFLUENZA A/B    EKG   Radiology No results found.  Procedures Procedures (including critical care time)  Medications Ordered in UC Medications - No data to display  Initial Impression / Assessment and Plan / UC Course  I have reviewed the triage vital signs and the nursing notes.  Pertinent labs & imaging results that were available during my care of the patient were reviewed by me and considered in my medical decision making (see chart for details).     COVID is positive.Discussed treatment.  He had antibiotic treatment last time he had COVID but this is no longer available.  He questions whether ivermectin might help, I told him that I have never prescribed it and there is no science behind it.  I did offer him paxlovid.  Discussed holding the statin while on this medication.   Discussed quarantine and mask to prevent spread Final Clinical Impressions(s) / UC Diagnoses   Final diagnoses:  Cough, unspecified type  COVID-19     Discharge Instructions      Take paxlovid 2 times a day for 5 days Do not take atorvastatin (Lipitor) while on paxlovid May take over-the-counter cough and cold medicine as needed May take Tylenol or ibuprofen for any pain and fever Need to wear a mask for 10 days after symptom onset, stay out of work until Sunday   ED Prescriptions     Medication Sig Dispense Auth. Provider   nirmatrelvir/ritonavir EUA (PAXLOVID) 20 x 150 MG & 10 x 100MG  TABS Take 3 tablets by mouth 2 (two) times daily for 5 days. Patient GFR is 67. Take nirmatrelvir (150 mg) two tablets twice daily for 5 days and ritonavir (100 mg) one tablet twice daily for 5 days. 30 tablet , MD      PDMP not reviewed this encounter.   Eustace Moore, MD 11/07/21 2026

## 2021-11-07 NOTE — ED Triage Notes (Signed)
Nasal congestion, cough, sore throat x 4 days.Had Covid last year Unvaccinated.

## 2021-11-07 NOTE — Discharge Instructions (Signed)
Take paxlovid 2 times a day for 5 days Do not take atorvastatin (Lipitor) while on paxlovid May take over-the-counter cough and cold medicine as needed May take Tylenol or ibuprofen for any pain and fever Need to wear a mask for 10 days after symptom onset, stay out of work until Sunday

## 2021-11-10 ENCOUNTER — Encounter: Payer: Self-pay | Admitting: Family Medicine

## 2021-12-25 ENCOUNTER — Emergency Department
Admission: EM | Admit: 2021-12-25 | Discharge: 2021-12-25 | Disposition: A | Discharge status: Home / Self Care | Payer: 59 | Source: Home / Self Care

## 2021-12-25 DIAGNOSIS — T148XXA Other injury of unspecified body region, initial encounter: Secondary | ICD-10-CM

## 2021-12-25 DIAGNOSIS — Z23 Encounter for immunization: Secondary | ICD-10-CM | POA: Diagnosis not present

## 2021-12-25 MED ORDER — CEPHALEXIN 500 MG PO CAPS: 500.0000 mg | ORAL_CAPSULE | Freq: Two times a day (BID) | ORAL | 0 refills | Status: AC

## 2021-12-25 MED ORDER — TETANUS-DIPHTH-ACELL PERTUSSIS 5-2.5-18.5 LF-MCG/0.5 IM SUSY
0.5000 mL | PREFILLED_SYRINGE | Freq: Once | INTRAMUSCULAR | Status: AC
  Administered 2021-12-25: 0.5 mL via INTRAMUSCULAR

## 2021-12-25 NOTE — ED Provider Notes (Signed)
[facility]    CSN: [id number] Arrival date & time: 12/25/21  1804      History   Chief Complaint Chief Complaint  Patient presents with   Puncture Wound    HPI Brett Arnold is a 66 y.o. male.   HPI 66 year old male presents with puncture wound to right thumb.  Patient reports removing thick stable from rug and accidentally punctured his right thumb with this table trying to remove.  Past Medical History:  Diagnosis Date   BMI 31.0-31.9,adult    Diabetes ([place])    Hyperlipemia    Hypertension     Patient Active Problem List   Diagnosis Date Noted   BMI 31.0-31.9,adult    Diabetes ([place])    Hypertension    Hyperlipemia    Pneumonia due to COVID-19 virus 10/17/2020    History reviewed. No pertinent surgical history.     Home Medications    Prior to Admission medications   Medication Sig Start Date End Date Taking? Authorizing Provider  cephALEXin (KEFLEX) 500 MG capsule Take 1 capsule (500 mg total) by mouth 2 (two) times daily for 7 days. 12/25/21 01/01/22 Yes Eliezer Lofts, FNP  AMBULATORY NON FORMULARY MEDICATION Supply ordered: CPAP and other supplies needed (headgear, cushions, filters, heated tuubing and water chamber) Dx: obstructive sleep apnea Settings: auto-titration 09/01/20   Emeterio Reeve, DO  atorvastatin (LIPITOR) 80 MG tablet Take 1 tablet (80 mg total) by mouth daily. 04/27/21   Emeterio Reeve, DO  Chlorphen-PE-Acetaminophen (CORICIDIN D COLD/FLU/SINUS PO) Take by mouth.    [provider]  diclofenac (VOLTAREN) 75 MG EC tablet TAKE 1 TABLET(75 MG) BY MOUTH TWICE DAILY Patient not taking: Reported on 04/27/2021 04/04/21   Aundra Dubin, PA-C  febuxostat (ULORIC) 40 MG tablet Take 1 tablet (40 mg total) by mouth daily. 01/13/21   Leandrew Koyanagi, MD  fexofenadine [facility] ALLERGY) 180 MG tablet Take 1 tablet (180 mg total) by mouth daily for 15 days. 08/25/21 09/09/21  Eliezer Lofts, FNP  loratadine (CLARITIN) 10 MG  tablet Take 1 tablet (10 mg total) by mouth daily. 04/27/21   Emeterio Reeve, DO  losartan (COZAAR) 25 MG tablet Take 1 tablet (25 mg total) by mouth daily. 04/27/21   Emeterio Reeve, DO  metFORMIN (GLUCOPHAGE XR) 500 MG 24 hr tablet Take 1 tablet (500 mg total) by mouth daily with breakfast. 04/27/21 04/27/22  Emeterio Reeve, DO    Family History Family History  Problem Relation Age of Onset   Emphysema Mother    Lung disease Mother        lung cancer fluid   Hypertension Father    Stroke Father    Heart failure Brother     Social History Social History   Tobacco Use   Smoking status: Former    Years: 35.00    Types: Cigarettes    Quit date: 08/25/2017    Years since quitting: 4.3   Smokeless tobacco: Never  Vaping Use   Vaping Use: Never used  Substance Use Topics   Alcohol use: Yes    Comment: occasionally   Drug use: Not Currently     Allergies   Patient has no known allergies.   Review of Systems Review of Systems  Skin:        Right thumb puncture wound since this afternoon  All other systems reviewed and are negative.   Physical Exam Triage Vital Signs ED Triage Vitals  Enc Vitals Group     BP 12/25/21  1833 139/89     Pulse Rate 12/25/21 1833 66     Resp 12/25/21 1833 20     Temp 12/25/21 1833 97.6 F (36.4 C)     Temp Source 12/25/21 1833 Oral     SpO2 12/25/21 1833 95 %     Weight 12/25/21 1829 220 lb (99.8 kg)     Height 12/25/21 1829 5\' 10"  (1.778 m)     Head Circumference --      Peak Flow --      Pain Score 12/25/21 1828 1     Pain Loc --      Pain Edu? --      Excl. in [place]? --    No data found.  Updated Vital Signs BP 139/89 (BP Location: Right Arm)    Pulse 66    Temp 97.6 F (36.4 C) (Oral)    Resp 20    Ht 5\' 10"  (1.778 m)    Wt 220 lb (99.8 kg)    SpO2 95%    BMI 31.57 kg/m   Physical Exam Vitals and nursing note reviewed.  Constitutional:      General: He is not in acute distress.    Appearance: Normal appearance.  He is obese. He is not ill-appearing.  HENT:     Head: Normocephalic and atraumatic.     Mouth/Throat:     Mouth: Mucous membranes are moist.     Pharynx: Oropharynx is clear.  Eyes:     Extraocular Movements: Extraocular movements intact.     Conjunctiva/sclera: Conjunctivae normal.     Pupils: Pupils are equal, round, and reactive to light.  Cardiovascular:     Rate and Rhythm: Normal rate and regular rhythm.     Pulses: Normal pulses.     Heart sounds: Normal heart sounds.  Pulmonary:     Effort: Pulmonary effort is normal.     Breath sounds: Normal breath sounds.  Musculoskeletal:     Cervical back: Normal range of motion and neck supple.  Skin:    General: Skin is warm and dry.     Comments: Right thumb (volar aspect): 2 tiny (<3 mm) mildly erythematous puncture wounds noted, no bleeding, discharge, draining, or lymphatic streaking  Neurological:     General: No focal deficit present.     Mental Status: He is alert and oriented to person, place, and time.     UC Treatments / Results  Labs (all labs ordered are listed, but only abnormal results are displayed) Labs Reviewed - No data to display  EKG   Radiology No results found.  Procedures Procedures (including critical care time)  Medications Ordered in UC Medications  Tdap (BOOSTRIX) injection 0.5 mL (0.5 mLs Intramuscular Given 12/25/21 1849)    Initial Impression / Assessment and Plan / UC Course  I have reviewed the triage vital signs and the nursing notes.  Pertinent labs & imaging results that were available during my care of the patient were reviewed by me and considered in my medical decision making (see chart for details).     MDM: 1.  Puncture wound-Tdap given to patient prior to discharge, Rx'd Keflex. Advised patient to take medication as directed with food to completion.  Encouraged patient increase daily water intake while taking this medication.  Patient discharged home, hemodynamically  stable. Final Clinical Impressions(s) / UC Diagnoses   Final diagnoses:  Puncture wound     Discharge Instructions      Advised patient to take medication as  directed with food to completion.  Encouraged patient increase daily water intake while taking this medication.     ED Prescriptions     Medication Sig Dispense Auth. Provider   cephALEXin (KEFLEX) 500 MG capsule Take 1 capsule (500 mg total) by mouth 2 (two) times daily for 7 days. 14 capsule Eliezer Lofts, FNP      PDMP not reviewed this encounter.   Eliezer Lofts, [place] 12/25/21 1858

## 2021-12-25 NOTE — Discharge Instructions (Addendum)
Advised patient to take medication as directed with food to completion.  Encouraged patient increase daily water intake while taking this medication. 

## 2021-12-25 NOTE — ED Triage Notes (Addendum)
Pt presents to Urgent Care with wound to R thumb--states he was trying to remove a "thick" staple from a rug and punctured thumb w/ the staple. 2 small puncture wounds observed to R thumb. States he thinks he needs a Tetanus shot because it's been at least 7 years since he had one.

## 2022-01-28 ENCOUNTER — Encounter: Payer: Self-pay | Admitting: Family Medicine

## 2022-02-11 ENCOUNTER — Emergency Department
Admission: RE | Admit: 2022-02-11 | Discharge: 2022-02-11 | Disposition: A | Discharge status: Home / Self Care | Payer: 59 | Source: Ambulatory Visit

## 2022-02-11 VITALS — BP 132/83 | HR 95 | Temp 97.8°F | Resp 15 | Wt 210.0 lb

## 2022-02-11 DIAGNOSIS — J029 Acute pharyngitis, unspecified: Secondary | ICD-10-CM

## 2022-02-11 LAB — POCT RAPID STREP A (OFFICE): Rapid Strep A Screen: NEGATIVE

## 2022-02-11 MED ORDER — LIDOCAINE VISCOUS HCL 2 % MT SOLN: 10.0000 mL | OROMUCOSAL | 0 refills | Status: DC | PRN

## 2022-02-11 MED ORDER — CEFDINIR 300 MG PO CAPS: 300.0000 mg | ORAL_CAPSULE | Freq: Two times a day (BID) | ORAL | 0 refills | Status: AC

## 2022-02-11 MED ORDER — NYSTATIN 100000 UNIT/ML MT SUSP: 500000.0000 [IU] | Freq: Three times a day (TID) | OROMUCOSAL | 0 refills | Status: DC | PRN

## 2022-02-11 NOTE — ED Triage Notes (Signed)
Sore throat for a few days  ?Worse today  ?Denies fever ?

## 2022-02-11 NOTE — ED Provider Notes (Signed)
?[facility] ? ? ? ?CSN: [id number] ?Arrival date & time: 02/11/22  1051 ? ? ?  ? ?History   ?Chief Complaint ?Chief Complaint  ?Patient presents with  ? Sore Throat  ?  Entered by patient  ? ? ?HPI ?Brett Arnold is a 66 y.o. male.  ? ?HPI ?Patient presents today with a 4-day history of sore throat.  No known exposure to strep.  He endorses significant pain with swallowing and eating.  He endorses that pain is alleviated with drinking beverages.  He is a diabetic although denies any recent use of antibiotics or steroids.  He was placed on a back-to-back course of steroids over a month ago.  He denies any other URI symptoms and he is afebrile ? ?Past Medical History:  ?Diagnosis Date  ? BMI 31.0-31.9,adult   ? Diabetes (H[facility])   ? Hyperlipemia   ? Hypertension   ? ? ?Patient Active Problem List  ? Diagnosis Date Noted  ? BMI 31.0-31.9,adult   ? Diabetes (H[facility])   ? Hypertension   ? Hyperlipemia   ? Pneumonia due to COVID-19 virus 10/17/2020  ? ? ?Past Surgical History:  ?Procedure Laterality Date  ? JOINT REPLACEMENT Left   ? knee  ? ? ? ? ? ?Home Medications   ? ?Prior to Admission medications   ?Medication Sig Start Date End Date Taking? Authorizing Provider  ?AMBULATORY NON FORMULARY MEDICATION Supply ordered: CPAP and other supplies needed (headgear, cushions, filters, heated tuubing and water chamber) ?Dx: obstructive sleep apnea ?Settings: auto-titration 09/01/20   Sunnie Nielsen, DO  ?atorvastatin (LIPITOR) 80 MG tablet Take 1 tablet (80 mg total) by mouth daily. 04/27/21   Sunnie Nielsen, DO  ?Chlorphen-PE-Acetaminophen (CORICIDIN D COLD/FLU/SINUS PO) Take by mouth.    [provider]  ?diclofenac (VOLTAREN) 75 MG EC tablet TAKE 1 TABLET(75 MG) BY MOUTH TWICE DAILY ?Patient not taking: Reported on 04/27/2021 04/04/21   Cristie Hem, PA-C  ?febuxostat (ULORIC) 40 MG tablet Take 1 tablet (40 mg total) by mouth daily. 01/13/21   Tarry Kos, MD  ?fexofenadine [facility] ALLERGY) 180 MG  tablet Take 1 tablet (180 mg total) by mouth daily for 15 days. 08/25/21 09/09/21  Trevor Iha, FNP  ?loratadine (CLARITIN) 10 MG tablet Take 1 tablet (10 mg total) by mouth daily. 04/27/21   Sunnie Nielsen, DO  ?losartan (COZAAR) 25 MG tablet Take 1 tablet (25 mg total) by mouth daily. 04/27/21   Sunnie Nielsen, DO  ?metFORMIN (GLUCOPHAGE XR) 500 MG 24 hr tablet Take 1 tablet (500 mg total) by mouth daily with breakfast. 04/27/21 04/27/22  Sunnie Nielsen, DO  ? ? ?Family History ?Family History  ?Problem Relation Age of Onset  ? Emphysema Mother   ? Lung disease Mother   ?     lung cancer fluid  ? Hypertension Father   ? Stroke Father   ? Heart failure Brother   ? ? ?Social History ?Social History  ? ?Tobacco Use  ? Smoking status: Former  ?  Years: 35.00  ?  Types: Cigarettes  ?  Quit date: 08/25/2017  ?  Years since quitting: 4.4  ? Smokeless tobacco: Never  ?Vaping Use  ? Vaping Use: Never used  ?Substance Use Topics  ? Alcohol use: Yes  ?  Comment: occasionally  ? Drug use: Not Currently  ? ? ? ?Allergies   ?Patient has no known allergies. ? ? ?Review of Systems ?Review of Systems ?Pertinent negatives listed in HPI  ?Physical Exam ?  Triage Vital Signs ?ED Triage Vitals  ?Enc Vitals Group  ?   BP 02/11/22 1102 132/83  ?   Pulse Rate 02/11/22 1102 95  ?   Resp 02/11/22 1102 15  ?   Temp 02/11/22 1102 97.8 ?F (36.6 ?C)  ?   Temp Source 02/11/22 1102 Oral  ?   SpO2 02/11/22 1102 95 %  ?   Weight 02/11/22 1107 210 lb (95.3 kg)  ?   Height --   ?   Head Circumference --   ?   Peak Flow --   ?   Pain Score 02/11/22 1103 2  ?   Pain Loc --   ?   Pain Edu? --   ?   Excl. in [place]? --   ? ?No data found. ? ?Updated Vital Signs ?BP 132/83 (BP Location: Left Arm)   Pulse 95   Temp 97.8 ?F (36.6 ?C) (Oral)   Resp 15   Wt 210 lb (95.3 kg)   SpO2 95%   BMI 30.13 kg/m?  ? ?Visual Acuity ?Right Eye Distance:   ?Left Eye Distance:   ?Bilateral Distance:   ? ?Right Eye Near:   ?Left Eye Near:    ?Bilateral Near:     ? ?Physical Exam ?Constitutional:   ?   Appearance: He is well-developed.  ?HENT:  ?   Head: Normocephalic and atraumatic.  ?   Mouth/Throat:  ?   Pharynx: Pharyngeal swelling, posterior oropharyngeal erythema and uvula swelling present.  ?   Comments: White blistery lesion cleft palate with white exudate ?Cardiovascular:  ?   Rate and Rhythm: Normal rate and regular rhythm.  ?Neurological:  ?   General: No focal deficit present.  ?   Mental Status: He is alert.  ?Psychiatric:     ?   Mood and Affect: Mood normal.     ?   Behavior: Behavior normal.  ? ? ? ?UC Treatments / Results  ?Labs ?(all labs ordered are listed, but only abnormal results are displayed) ?Labs Reviewed  ?POCT RAPID STREP A (OFFICE)  ? ? ?EKG ? ? ?Radiology ?No results found. ? ?Procedures ?Procedures (including critical care time) ? ?Medications Ordered in UC ?Medications - No data to display ? ?Initial Impression / Assessment and Plan / UC Course  ?I have reviewed the triage vital signs and the nursing notes. ? ?Pertinent labs & imaging results that were available during my care of the patient were reviewed by me and considered in my medical decision making (see chart for details). ? ?  ?Rapid strep negative. Given appearance of throat, covering for possible strep. Also ordered lidocaine and nystatin for management of throat pain.  Strict return precautions given if symptoms worsen or do not readily improve. ?Final Clinical Impressions(s) / UC Diagnoses  ? ?Final diagnoses:  ?Acute pharyngitis, unspecified etiology  ? ?Discharge Instructions   ?None ?  ? ?ED Prescriptions   ? ? Medication Sig Dispense Auth. Provider  ? cefdinir (OMNICEF) 300 MG capsule Take 1 capsule (300 mg total) by mouth 2 (two) times daily for 7 days. 14 capsule Bing Neighbors, FNP  ? nystatin (MYCOSTATIN) 100000 UNIT/ML suspension Take 5 mLs (500,000 Units total) by mouth 3 (three) times daily as needed. Mix with 10 ml lidocaine viscous. Gargles and spit 60 mL Bing Neighbors, FNP  ? lidocaine (XYLOCAINE) 2 % solution Use as directed 10 mLs in the mouth or throat every 3 (three) hours as needed for mouth pain (  throat pain. Mix with 5 ml Nystatin gargle and spit.). 100 mL Bing NeighborsHarris, [place] Agent S, FNP  ? ?  ? ?PDMP not reviewed this encounter. ?  ?Bing NeighborsHarris, Dianna Deshler S, FNP ?02/11/22 1151 ? ?

## 2022-03-18 IMAGING — DX DG CHEST 2V
2 series · 2 of 2 positions shown · non-contrast
Comparison: None.

CLINICAL DATA: Cough, fever, and chills.

EXAM:
CHEST - 2 VIEW

[chest pa]
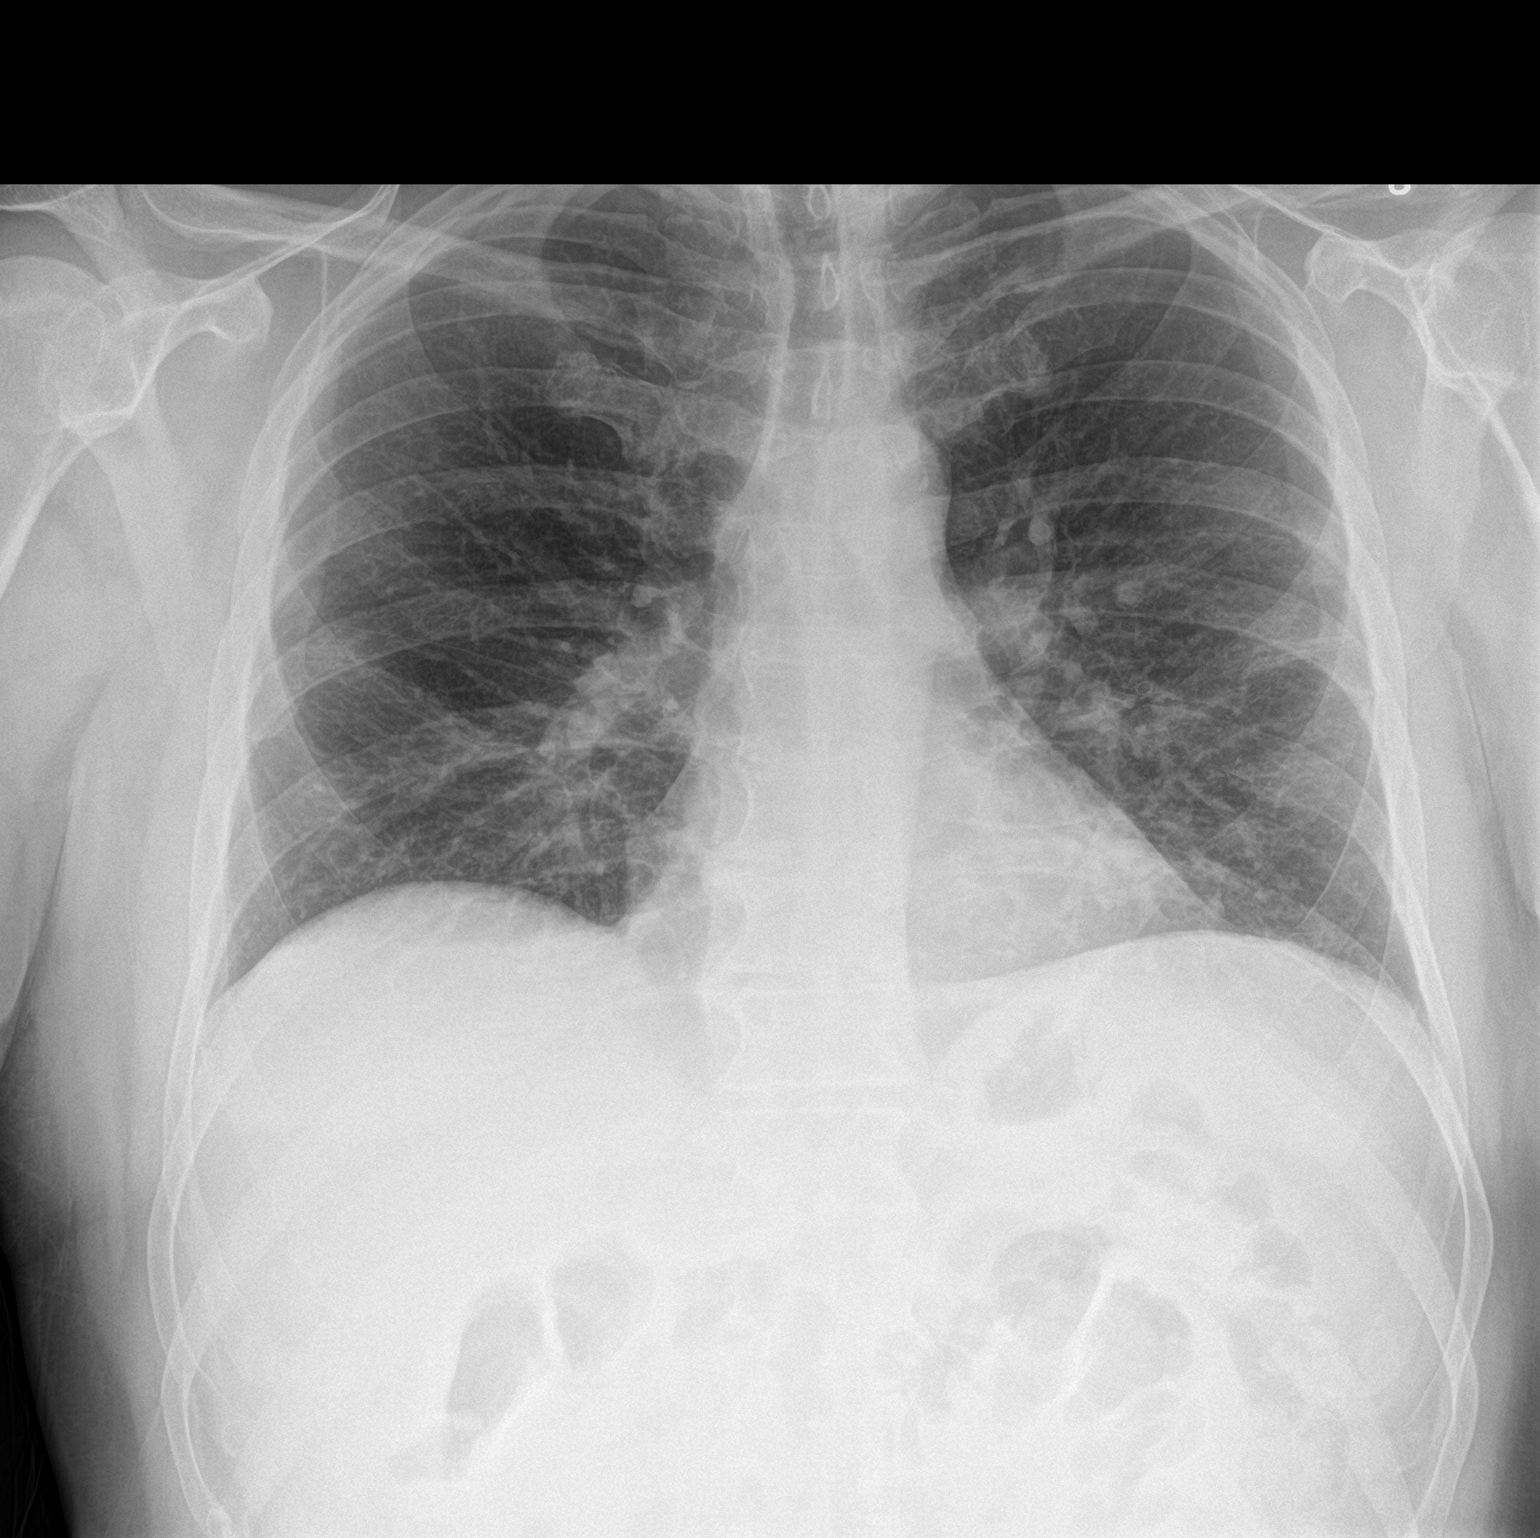

[chest lat]
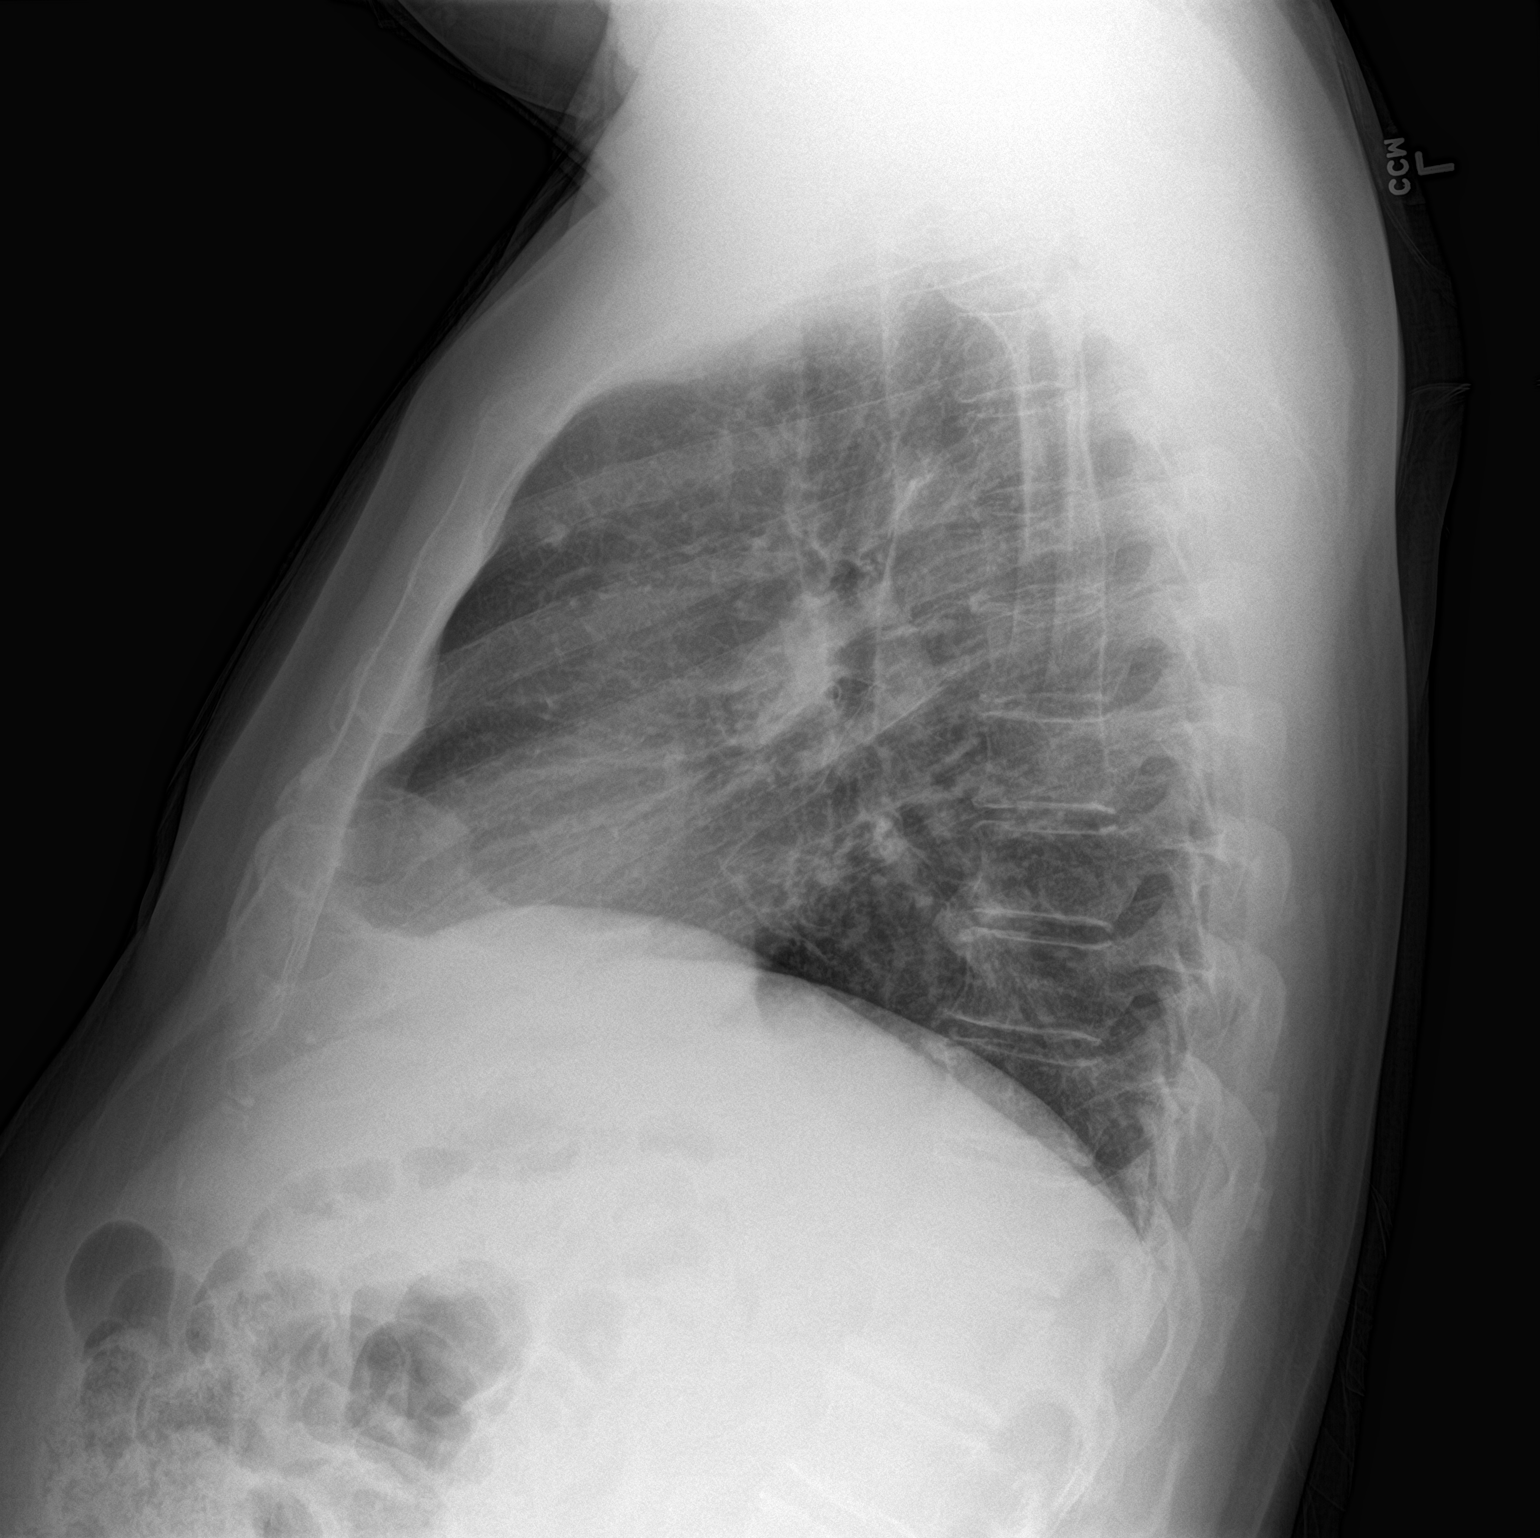

[2 of 2 positions shown; findings below may reference images not displayed]

FINDINGS: Lung volumes are low. Patchy and streaky bilateral airspace
opacities in a mid-lower lung zone predominant distribution. Heart
is normal in size with normal mediastinal contours. Aortic
atherosclerosis. 7 mm pulmonary nodule in the anterior left upper
lobe. Pleural effusion or pneumothorax. No acute osseous
abnormalities are seen.
IMPRESSION: 1. Low lung volumes with patchy and streaky bilateral airspace
opacities in a mid-lower lung zone predominant distribution,
suspicious for multifocal pneumonia. COVID pneumonia is considered.
2. Left upper lobe 7 mm pulmonary nodule. Recommend nonemergent
chest CT for characterization on an elective basis.

## 2022-04-19 ENCOUNTER — Ambulatory Visit: Payer: 59 | Admitting: Family Medicine

## 2022-04-19 ENCOUNTER — Encounter: Payer: Self-pay | Admitting: Family Medicine

## 2022-04-19 VITALS — BP 126/77 | HR 70 | Ht 70.0 in | Wt 215.0 lb

## 2022-04-19 DIAGNOSIS — I10 Essential (primary) hypertension: Secondary | ICD-10-CM | POA: Diagnosis not present

## 2022-04-19 DIAGNOSIS — L219 Seborrheic dermatitis, unspecified: Secondary | ICD-10-CM

## 2022-04-19 DIAGNOSIS — E782 Mixed hyperlipidemia: Secondary | ICD-10-CM

## 2022-04-19 DIAGNOSIS — Z1211 Encounter for screening for malignant neoplasm of colon: Secondary | ICD-10-CM

## 2022-04-19 DIAGNOSIS — E119 Type 2 diabetes mellitus without complications: Secondary | ICD-10-CM

## 2022-04-19 DIAGNOSIS — Z125 Encounter for screening for malignant neoplasm of prostate: Secondary | ICD-10-CM

## 2022-04-19 MED ORDER — ATORVASTATIN CALCIUM 80 MG PO TABS: 80.0000 mg | ORAL_TABLET | Freq: Every day | ORAL | 3 refills | Status: DC

## 2022-04-19 MED ORDER — METFORMIN HCL ER 500 MG PO TB24: 500.0000 mg | ORAL_TABLET | Freq: Every day | ORAL | 3 refills | Status: DC

## 2022-04-19 MED ORDER — KETOCONAZOLE 2 % EX SHAM: 1.0000 | MEDICATED_SHAMPOO | CUTANEOUS | 2 refills | Status: AC

## 2022-04-19 MED ORDER — LOSARTAN POTASSIUM 25 MG PO TABS: 25.0000 mg | ORAL_TABLET | Freq: Every day | ORAL | 3 refills | Status: DC

## 2022-04-19 NOTE — Assessment & Plan Note (Signed)
Trial ketoconazole applied to face and scalp twice per week.

## 2022-04-19 NOTE — Assessment & Plan Note (Signed)
Checking A1c with fasting labs.  For now continue metformin and will make adjustments to his medications as needed.  Recommend that he continue to work on diet to help with management of his diabetes.

## 2022-04-19 NOTE — Progress Notes (Signed)
Brett Arnold - 66 y.o. male MRN [id number]  Date of birth: 29-Aug-1956  Subjective Chief Complaint  Patient presents with   Hypertension   Diabetes   Hyperlipidemia    HPI Brett Arnold is a 66 year old male here today for follow-up.  He is a previous patient of Dr. Lyn Hollingshead.  History of hypertension that is currently managed with losartan.  Reports he is doing well with it.  Denies side effects at this time.  He has not had chest pain, shortness of breath, palpitations, headaches or vision changes.  Diabetes has been pretty well controlled with metformin.  Related to side effects from this as well.  No complications related to his diabetes that he is aware of.  He is taking atorvastatin for associated hyperlipidemia and tolerating this well.  He is due for updated colon cancer screening.  Would like referral to gastroenterology.  He is interested in shingles vaccine but wants to hold off for now.  ROS:  A comprehensive ROS was completed and negative except as noted per HPI  No Known Allergies  Past Medical History:  Diagnosis Date   BMI 31.0-31.9,adult    Diabetes (HCC)    Hyperlipemia    Hypertension     Past Surgical History:  Procedure Laterality Date   JOINT REPLACEMENT Left    knee    Social History   Socioeconomic History   Marital status: Single    Spouse name: Not on file   Number of children: Not on file   Years of education: Not on file   Highest education level: Not on file  Occupational History   Not on file  Tobacco Use   Smoking status: Former    Years: 35.00    Types: Cigarettes    Quit date: 08/25/2017    Years since quitting: 4.6   Smokeless tobacco: Never  Vaping Use   Vaping Use: Never used  Substance and Sexual Activity   Alcohol use: Yes    Comment: occasionally   Drug use: Not Currently   Sexual activity: Not on file  Other Topics Concern   Not on file  Social History Narrative   Not on file   Social Determinants of Health    Financial Resource Strain: Not on file  Food Insecurity: Not on file  Transportation Needs: Not on file  Physical Activity: Not on file  Stress: Not on file  Social Connections: Not on file    Family History  Problem Relation Age of Onset   Emphysema Mother    Lung disease Mother        lung cancer fluid   Hypertension Father    Stroke Father    Heart failure Brother     Health Maintenance  Topic Date Due   COVID-19 Vaccine (1) Never done   Zoster Vaccines- Shingrix (1 of 2) Never done   HEMOGLOBIN A1C  10/27/2021   COLONOSCOPY (Pts 45-48yrs Insurance coverage will need to be confirmed)  04/20/2023 (Originally 11/22/2002)   Hepatitis C Screening  04/20/2023 (Originally 11/23/1975)   HIV Screening  04/20/2023 (Originally 11/22/1972)   INFLUENZA VACCINE  06/05/2022   OPHTHALMOLOGY EXAM  11/02/2022   FOOT EXAM  04/20/2023   TETANUS/TDAP  12/26/2031   HPV VACCINES  Aged Out     ----------------------------------------------------------------------------------------------------------------------------------------------------------------------------------------------------------------- Physical Exam BP 126/77 (BP Location: Left Arm, Patient Position: Sitting, Cuff Size: Normal)   Pulse 70   Ht 5\' 10"  (1.778 m)   Wt 215 lb (97.5 kg)   SpO2 96%  BMI 30.85 kg/m   Physical Exam Constitutional:      General: He is not in acute distress.    Appearance: Normal appearance.  HENT:     Head: Normocephalic and atraumatic.     Right Ear: Tympanic membrane and external ear normal.     Left Ear: Tympanic membrane and external ear normal.  Eyes:     General: No scleral icterus. Neck:     Thyroid: No thyromegaly.  Cardiovascular:     Rate and Rhythm: Normal rate and regular rhythm.     Heart sounds: Normal heart sounds.  Pulmonary:     Effort: Pulmonary effort is normal.     Breath sounds: Normal breath sounds.  Abdominal:     General: Bowel sounds are normal. There is no  distension.     Palpations: Abdomen is soft.     Tenderness: There is no abdominal tenderness. There is no guarding.  Musculoskeletal:     Cervical back: Normal range of motion.  Lymphadenopathy:     Cervical: No cervical adenopathy.  Skin:    General: Skin is warm and dry.     Findings: No rash.  Neurological:     Mental Status: He is alert and oriented to person, place, and time.     Cranial Nerves: No cranial nerve deficit.     Motor: No abnormal muscle tone.  Psychiatric:        Mood and Affect: Mood normal.        Behavior: Behavior normal.     ------------------------------------------------------------------------------------------------------------------------------------------------------------------------------------------------------------------- Assessment and Plan  Diabetes (HCC) Checking A1c with fasting labs.  For now continue metformin and will make adjustments to his medications as needed.  Recommend that he continue to work on diet to help with management of his diabetes.  Hyperlipemia Tolerating atorvastatin well.  Updating lipid panel.  Hypertension His blood pressure is well controlled at this time with losartan.  Recommend continuation at current strength.  Seborrheic dermatitis Trial ketoconazole applied to face and scalp twice per week.   Meds ordered this encounter  Medications   losartan (COZAAR) 25 MG tablet    Sig: Take 1 tablet (25 mg total) by mouth daily.    Dispense:  90 tablet    Refill:  3   metFORMIN (GLUCOPHAGE XR) 500 MG 24 hr tablet    Sig: Take 1 tablet (500 mg total) by mouth daily with breakfast.    Dispense:  90 tablet    Refill:  3   atorvastatin (LIPITOR) 80 MG tablet    Sig: Take 1 tablet (80 mg total) by mouth daily.    Dispense:  90 tablet    Refill:  3    Return in about 6 months (around 10/19/2022) for HTN/T2DM.    This visit occurred during the SARS-CoV-2 public health emergency.  Safety protocols were in place,  including screening questions prior to the visit, additional usage of staff PPE, and extensive cleaning of exam room while observing appropriate contact time as indicated for disinfecting solutions.

## 2022-04-19 NOTE — Assessment & Plan Note (Signed)
His blood pressure is well controlled at this time with losartan.  Recommend continuation at current strength.

## 2022-04-19 NOTE — Assessment & Plan Note (Signed)
Tolerating atorvastatin well.  Updating lipid panel. 

## 2022-04-19 NOTE — Patient Instructions (Signed)

## 2022-04-21 LAB — COMPLETE METABOLIC PANEL WITH GFR
AG Ratio: 2 (calc) (ref 1.0–2.5)
ALT: 27 U/L (ref 9–46)
AST: 12 U/L (ref 10–35)
Albumin: 4 g/dL (ref 3.6–5.1)
Alkaline phosphatase (APISO): 75 U/L (ref 35–144)
BUN: 23 mg/dL (ref 7–25)
CO2: 22 mmol/L (ref 20–32)
Calcium: 9 mg/dL (ref 8.6–10.3)
Chloride: 110 mmol/L (ref 98–110)
Creat: 0.94 mg/dL (ref 0.70–1.35)
Globulin: 2 g/dL (calc) (ref 1.9–3.7)
Glucose, Bld: 155 mg/dL — ABNORMAL HIGH (ref 65–99)
Potassium: 4.4 mmol/L (ref 3.5–5.3)
Sodium: 140 mmol/L (ref 135–146)
Total Bilirubin: 0.4 mg/dL (ref 0.2–1.2)
Total Protein: 6 g/dL — ABNORMAL LOW (ref 6.1–8.1)
eGFR: 91 mL/min/{1.73_m2} (ref >=60)

## 2022-04-21 LAB — CBC WITH DIFFERENTIAL/PLATELET
Absolute Monocytes: 426 cells/uL (ref 200–950)
Basophils Absolute: 48 cells/uL (ref 0–200)
Basophils Relative: 0.8 %
Eosinophils Absolute: 210 cells/uL (ref 15–500)
Eosinophils Relative: 3.5 %
HCT: 47.9 % (ref 38.5–50.0)
Hemoglobin: 15.7 g/dL (ref 13.2–17.1)
Lymphs Abs: 1836 cells/uL (ref 850–3900)
MCH: 29.7 pg (ref 27.0–33.0)
MCHC: 32.8 g/dL (ref 32.0–36.0)
MCV: 90.5 fL (ref 80.0–100.0)
MPV: 11 fL (ref 7.5–12.5)
Monocytes Relative: 7.1 %
Neutro Abs: 3480 cells/uL (ref 1500–7800)
Neutrophils Relative %: 58 %
Platelets: 269 10*3/uL (ref 140–400)
RBC: 5.29 10*6/uL (ref 4.20–5.80)
RDW: 11.8 % (ref 11.0–15.0)
Total Lymphocyte: 30.6 %
WBC: 6 10*3/uL (ref 3.8–10.8)

## 2022-04-21 LAB — LIPID PANEL W/REFLEX DIRECT LDL
Cholesterol: 189 mg/dL (ref <200)
HDL: 44 mg/dL (ref >=40)
LDL Cholesterol (Calc): 117 mg/dL (calc) — ABNORMAL HIGH
Non-HDL Cholesterol (Calc): 145 mg/dL (calc) — ABNORMAL HIGH (ref <130)
Total CHOL/HDL Ratio: 4.3 (calc) (ref <5.0)
Triglycerides: 167 mg/dL — ABNORMAL HIGH (ref <150)

## 2022-04-21 LAB — HEMOGLOBIN A1C
Hgb A1c MFr Bld: 7.3 % of total Hgb — ABNORMAL HIGH (ref <5.7)
Mean Plasma Glucose: 163 mg/dL
eAG (mmol/L): 9 mmol/L

## 2022-04-21 LAB — PSA: PSA: 2.45 ng/mL (ref <=4.00)

## 2022-04-21 LAB — TSH: TSH: 0.73 mIU/L (ref 0.40–4.50)

## 2022-04-23 ENCOUNTER — Ambulatory Visit: Payer: 59 | Admitting: Family Medicine

## 2022-04-27 ENCOUNTER — Encounter: Payer: 59 | Admitting: Osteopathic Medicine

## 2022-04-27 ENCOUNTER — Encounter: Payer: 59 | Admitting: Family Medicine

## 2022-05-10 ENCOUNTER — Other Ambulatory Visit: Payer: Self-pay | Admitting: Osteopathic Medicine

## 2022-05-22 ENCOUNTER — Encounter: Payer: Self-pay | Admitting: Family Medicine

## 2022-05-23 ENCOUNTER — Other Ambulatory Visit: Payer: Self-pay | Admitting: Family Medicine

## 2022-05-23 ENCOUNTER — Other Ambulatory Visit: Payer: Self-pay | Admitting: Osteopathic Medicine

## 2022-05-23 MED ORDER — METFORMIN HCL ER 500 MG PO TB24: ORAL_TABLET | ORAL | 2 refills | Status: DC

## 2022-08-13 ENCOUNTER — Telehealth: Payer: Self-pay | Admitting: General Practice

## 2022-08-13 NOTE — Telephone Encounter (Signed)
Transition Care Management Follow-up Telephone Call Date of discharge and from where: 08/10/22 from [facility] How have you been since you were released from the hospital? Leg swelling has gotten better. He was concerned about a clot. Still has some calf pain. He didn't want to schedule a follow up at this time. Any questions or concerns? No  Items Reviewed: Did the pt receive and understand the discharge instructions provided? Yes  Medications obtained and verified? No  Other? No  Any new allergies since your discharge? No  Dietary orders reviewed? No Do you have support at home? Yes   Home Care and Equipment/Supplies: Were home health services ordered? no  Functional Questionnaire: (I = Independent and D = Dependent) ADLs: I  Bathing/Dressing- I  Meal Prep- I  Eating- I  Maintaining continence- I  Transferring/Ambulation- I  Managing Meds- I  Follow up appointments reviewed:  PCP Hospital f/u appt confirmed? No   Specialist Hospital f/u appt confirmed? No   Are transportation arrangements needed? No  If their condition worsens, is the pt aware to call PCP or go to the Emergency Dept.? Yes Was the patient provided with contact information for the PCP's office or ED? Yes Was to pt encouraged to call back with questions or concerns? Yes

## 2022-08-17 ENCOUNTER — Other Ambulatory Visit: Payer: Self-pay | Admitting: Family Medicine

## 2022-10-17 ENCOUNTER — Ambulatory Visit: Payer: 59 | Admitting: Family Medicine

## 2022-11-08 ENCOUNTER — Ambulatory Visit (INDEPENDENT_AMBULATORY_CARE_PROVIDER_SITE_OTHER): Payer: Medicare Other | Admitting: Family Medicine

## 2022-11-08 ENCOUNTER — Encounter: Payer: Self-pay | Admitting: Family Medicine

## 2022-11-08 VITALS — BP 120/72 | HR 70 | Ht 70.0 in | Wt 224.0 lb

## 2022-11-08 DIAGNOSIS — E785 Hyperlipidemia, unspecified: Secondary | ICD-10-CM | POA: Diagnosis not present

## 2022-11-08 DIAGNOSIS — E119 Type 2 diabetes mellitus without complications: Secondary | ICD-10-CM

## 2022-11-08 DIAGNOSIS — I10 Essential (primary) hypertension: Secondary | ICD-10-CM | POA: Diagnosis not present

## 2022-11-08 MED ORDER — METFORMIN HCL ER 500 MG PO TB24: ORAL_TABLET | ORAL | 2 refills | Status: DC

## 2022-11-08 MED ORDER — LOSARTAN POTASSIUM 25 MG PO TABS: 25.0000 mg | ORAL_TABLET | Freq: Every day | ORAL | 2 refills | Status: AC

## 2022-11-08 NOTE — Assessment & Plan Note (Signed)
BP remains well controlled.  Recommend continuation of losartan.

## 2022-11-08 NOTE — Progress Notes (Signed)
Brett Arnold - 67 y.o. male MRN [id number]  Date of birth: Apr 17, 1956  Subjective Chief Complaint  Patient presents with   Hyperlipidemia   Diabetes    HPI Brett Arnold is a 67 y.o. male here today for follow up visit.   Reports that he is doing pretty well.  He continues on metformin for management of diabetes.  Tolerating this well.  Activity level is about the same.  Diet is about the same.  Weight is up about 10lbs since last visit.  Previously on atorvastatin but has discontinued and started cholesterice.  Would like to see if this is effective.   Remains on losartan daily for HTN.  No side effects with medication.  Has not had chest pain, shortness of breath, palpitations, headache or vision changes.    ROS:  A comprehensive ROS was completed and negative except as noted per HPI  No Known Allergies  Past Medical History:  Diagnosis Date   BMI 31.0-31.9,adult    Diabetes ([place])    Hyperlipemia    Hypertension     Past Surgical History:  Procedure Laterality Date   JOINT REPLACEMENT Left    knee    Social History   Socioeconomic History   Marital status: Single    Spouse name: Not on file   Number of children: Not on file   Years of education: Not on file   Highest education level: Not on file  Occupational History   Not on file  Tobacco Use   Smoking status: Former    Years: 35.00    Types: Cigarettes    Quit date: 08/25/2017    Years since quitting: 5.2   Smokeless tobacco: Never  Vaping Use   Vaping Use: Never used  Substance and Sexual Activity   Alcohol use: Yes    Comment: occasionally   Drug use: Not Currently   Sexual activity: Not on file  Other Topics Concern   Not on file  Social History Narrative   Not on file   Social Determinants of Health   Financial Resource Strain: Not on file  Food Insecurity: Not on file  Transportation Needs: Not on file  Physical Activity: Not on file  Stress: Not on file  Social Connections: Not on  file    Family History  Problem Relation Age of Onset   Emphysema Mother    Lung disease Mother        lung cancer fluid   Hypertension Father    Stroke Father    Heart failure Brother     Health Maintenance  Topic Date Due   Medicare Annual Wellness (AWV)  Never done   Diabetic kidney evaluation - Urine ACR  Never done   HEMOGLOBIN A1C  10/20/2022   OPHTHALMOLOGY EXAM  11/02/2022   COLONOSCOPY (Pts 45-12yr Insurance coverage will need to be confirmed)  04/20/2023 (Originally 11/22/2002)   Hepatitis C Screening  04/20/2023 (Originally 11/23/1975)   HIV Screening  04/20/2023 (Originally 11/22/1972)   COVID-19 Vaccine (1) 11/06/2023 (Originally 05/22/1956)   INFLUENZA VACCINE  02/03/2024 (Originally 06/05/2022)   Zoster Vaccines- Shingrix (1 of 2) 02/07/2024 (Originally 11/23/2007)   FOOT EXAM  04/20/2023   Diabetic kidney evaluation - eGFR measurement  04/21/2023   DTaP/Tdap/Td (3 - Td or Tdap) 12/26/2031   HPV VACCINES  Aged Out     ----------------------------------------------------------------------------------------------------------------------------------------------------------------------------------------------------------------- Physical Exam BP 120/72 (BP Location: Left Arm, Patient Position: Sitting, Cuff Size: Large)   Pulse 70   Ht _0  (1.778  m)   Wt 224 lb (101.6 kg)   SpO2 97%   BMI 32.14 kg/m   Physical Exam Constitutional:      Appearance: Normal appearance.  HENT:     Head: Normocephalic and atraumatic.  Eyes:     General: No scleral icterus. Cardiovascular:     Rate and Rhythm: Normal rate and regular rhythm.  Pulmonary:     Effort: Pulmonary effort is normal.     Breath sounds: Normal breath sounds.  Musculoskeletal:     Cervical back: Neck supple.  Neurological:     General: No focal deficit present.     Mental Status: He is alert.  Psychiatric:        Mood and Affect: Mood normal.        Behavior: Behavior normal.      ------------------------------------------------------------------------------------------------------------------------------------------------------------------------------------------------------------------- Assessment and Plan  Hyperlipemia He has stopped atorvastatin.  Currently using red yeast rice supplement and fish oil.  Updating lipid panel today.   Hypertension BP remains well controlled.  Recommend continuation of losartan.   Diabetes ([place]) Blood sugars have been pretty well controlled. Updating A1c today.  Checking microalbumin.    Meds ordered this encounter  Medications   losartan (COZAAR) 25 MG tablet    Sig: Take 1 tablet (25 mg total) by mouth daily.    Dispense:  90 tablet    Refill:  2   metFORMIN (GLUCOPHAGE-XR) 500 MG 24 hr tablet    Sig: TAKE TWo TABLETS BY MOUTH ONE TIME DAILY WITH BREAKFAST    Dispense:  180 tablet    Refill:  2    Return in about 6 months (around 05/09/2023) for HTN/T2DM.    This visit occurred during the SARS-CoV-2 public health emergency.  Safety protocols were in place, including screening questions prior to the visit, additional usage of staff PPE, and extensive cleaning of exam room while observing appropriate contact time as indicated for disinfecting solutions.

## 2022-11-08 NOTE — Assessment & Plan Note (Addendum)
Blood sugars have been pretty well controlled. Updating A1c today.  Checking microalbumin.

## 2022-11-08 NOTE — Assessment & Plan Note (Addendum)
He has stopped atorvastatin.  Currently using red yeast rice supplement and fish oil.  Updating lipid panel today.

## 2022-11-09 ENCOUNTER — Encounter: Payer: Self-pay | Admitting: Family Medicine

## 2022-11-09 ENCOUNTER — Other Ambulatory Visit: Payer: Self-pay | Admitting: Family Medicine

## 2022-11-09 LAB — COMPLETE METABOLIC PANEL WITH GFR
AG Ratio: 1.7 (calc) (ref 1.0–2.5)
ALT: 23 U/L (ref 9–46)
AST: 11 U/L (ref 10–35)
Albumin: 4.4 g/dL (ref 3.6–5.1)
Alkaline phosphatase (APISO): 78 U/L (ref 35–144)
BUN: 15 mg/dL (ref 7–25)
CO2: 26 mmol/L (ref 20–32)
Calcium: 9.9 mg/dL (ref 8.6–10.3)
Chloride: 104 mmol/L (ref 98–110)
Creat: 0.9 mg/dL (ref 0.70–1.35)
Globulin: 2.6 g/dL (calc) (ref 1.9–3.7)
Glucose, Bld: 212 mg/dL — ABNORMAL HIGH (ref 65–99)
Potassium: 5 mmol/L (ref 3.5–5.3)
Sodium: 140 mmol/L (ref 135–146)
Total Bilirubin: 0.6 mg/dL (ref 0.2–1.2)
Total Protein: 7 g/dL (ref 6.1–8.1)
eGFR: 95 mL/min/{1.73_m2} (ref >=60)

## 2022-11-09 LAB — MICROALBUMIN / CREATININE URINE RATIO
Creatinine, Urine: 60 mg/dL (ref 20–320)
Microalb, Ur: <0.2 mg/dL

## 2022-11-09 LAB — LIPID PANEL W/REFLEX DIRECT LDL
Cholesterol: 270 mg/dL — ABNORMAL HIGH (ref <200)
HDL: 44 mg/dL (ref >=40)
LDL Cholesterol (Calc): 189 mg/dL (calc) — ABNORMAL HIGH
Non-HDL Cholesterol (Calc): 226 mg/dL (calc) — ABNORMAL HIGH (ref <130)
Total CHOL/HDL Ratio: 6.1 (calc) — ABNORMAL HIGH (ref <5.0)
Triglycerides: 191 mg/dL — ABNORMAL HIGH (ref <150)

## 2022-11-09 LAB — HEMOGLOBIN A1C
Hgb A1c MFr Bld: 9.1 % of total Hgb — ABNORMAL HIGH (ref <5.7)
Mean Plasma Glucose: 214 mg/dL
eAG (mmol/L): 11.9 mmol/L

## 2022-11-09 MED ORDER — ATORVASTATIN CALCIUM 80 MG PO TABS: 80.0000 mg | ORAL_TABLET | Freq: Every day | ORAL | 3 refills | Status: AC

## 2022-11-09 NOTE — Telephone Encounter (Signed)
Rx sent in. Thanks!

## 2022-11-29 ENCOUNTER — Ambulatory Visit
Admission: EM | Admit: 2022-11-29 | Discharge: 2022-11-29 | Disposition: A | Discharge status: Home / Self Care | Payer: Medicare Other | Attending: Family Medicine | Admitting: Family Medicine

## 2022-11-29 DIAGNOSIS — R6889 Other general symptoms and signs: Secondary | ICD-10-CM

## 2022-11-29 DIAGNOSIS — J209 Acute bronchitis, unspecified: Secondary | ICD-10-CM

## 2022-11-29 LAB — POC SARS CORONAVIRUS 2 AG -  ED: SARS Coronavirus 2 Ag: NEGATIVE

## 2022-11-29 LAB — POCT INFLUENZA A/B
Influenza A, POC: NEGATIVE
Influenza B, POC: NEGATIVE

## 2022-11-29 NOTE — ED Provider Notes (Signed)
[name]    CSN: [id number] Arrival date & time: 11/29/22  1127      History   Chief Complaint Chief Complaint  Patient presents with   Cough    HPI Brett Arnold is a 67 y.o. male.   HPI Patient states that yesterday had a cough.  Some chest pain.  This morning it was worse.  He states every time his coughs it hurts in the middle of his chest.  He has some nasal congestion.  Body aches and fatigue.  Concern for illness.  Past Medical History:  Diagnosis Date   BMI 31.0-31.9,adult    Diabetes ([place])    Hyperlipemia    Hypertension     Patient Active Problem List   Diagnosis Date Noted   Seborrheic dermatitis 04/19/2022   BMI 31.0-31.9,adult    Diabetes ([place])    Hypertension    Hyperlipemia    Pneumonia due to COVID-19 virus 10/17/2020    Past Surgical History:  Procedure Laterality Date   JOINT REPLACEMENT Left    knee       Home Medications    Prior to Admission medications   Medication Sig Start Date End Date Taking? Authorizing Provider  AMBULATORY NON FORMULARY MEDICATION Supply ordered: CPAP and other supplies needed (headgear, cushions, filters, heated tuubing and water chamber) Dx: obstructive sleep apnea Settings: auto-titration 09/01/20   Emeterio Reeve, DO  atorvastatin (LIPITOR) 80 MG tablet Take 1 tablet (80 mg total) by mouth daily. 11/09/22   Luetta Nutting, DO  ketoconazole (NIZORAL) 2 % shampoo Apply 1 Application topically 2 (two) times a week. Apply to face and scalp 04/19/22   Luetta Nutting, DO  loratadine (CLARITIN) 10 MG tablet Take 1 tablet (10 mg total) by mouth daily. 04/27/21   Emeterio Reeve, DO  losartan (COZAAR) 25 MG tablet Take 1 tablet (25 mg total) by mouth daily. 11/08/22   Luetta Nutting, DO  metFORMIN (GLUCOPHAGE-XR) 500 MG 24 hr tablet TAKE TWo TABLETS BY MOUTH ONE TIME DAILY WITH BREAKFAST 11/08/22   Luetta Nutting, DO  Omega-3 Fatty Acids (OMEGA-3 2100 PO) Take by mouth daily.    [provider]  OVER THE COUNTER MEDICATION Take by mouth daily. Cholesterice    [provider]    Family History Family History  Problem Relation Age of Onset   Emphysema Mother    Lung disease Mother        lung cancer fluid   Hypertension Father    Stroke Father    Heart failure Brother     Social History Social History   Tobacco Use   Smoking status: Former    Years: 35.00    Types: Cigarettes    Quit date: 08/25/2017    Years since quitting: 5.2   Smokeless tobacco: Never  Vaping Use   Vaping Use: Never used  Substance Use Topics   Alcohol use: Yes    Comment: occasionally   Drug use: Not Currently     Allergies   Patient has no known allergies.   Review of Systems Review of Systems See HPI  Physical Exam Triage Vital Signs ED Triage Vitals  Enc Vitals Group     BP 11/29/22 1133 (!) 170/92     Pulse Rate 11/29/22 1133 91     Resp 11/29/22 1133 17     Temp 11/29/22 1133 98.2 F (36.8 C)     Temp Source 11/29/22 1133 Oral     SpO2 11/29/22 1133 98 %  Weight --      Height --      Head Circumference --      Peak Flow --      Pain Score 11/29/22 1135 0     Pain Loc --      Pain Edu? --      Excl. in [place]? --    No data found.  Updated Vital Signs BP (!) 170/92 (BP Location: Right Arm)   Pulse 91   Temp 98.2 F (36.8 C) (Oral)   Resp 17   SpO2 98%      Physical Exam Constitutional:      General: He is not in acute distress.    Appearance: He is well-developed and normal weight. He is ill-appearing.  HENT:     Head: Normocephalic and atraumatic.  Eyes:     Conjunctiva/sclera: Conjunctivae normal.     Pupils: Pupils are equal, round, and reactive to light.  Cardiovascular:     Rate and Rhythm: Normal rate and regular rhythm.     Heart sounds: Normal heart sounds.  Pulmonary:     Effort: Pulmonary effort is normal. No respiratory distress.     Breath sounds: Normal breath sounds.  Abdominal:     General: There is no distension.      Palpations: Abdomen is soft.  Musculoskeletal:        General: Normal range of motion.     Cervical back: Normal range of motion.  Skin:    General: Skin is warm and dry.  Neurological:     Mental Status: He is alert.      UC Treatments / Results  Labs (all labs ordered are listed, but only abnormal results are displayed) Labs Reviewed  POC SARS CORONAVIRUS 2 AG -  ED  POCT INFLUENZA A/B    EKG   Radiology No results found.  Procedures Procedures (including critical care time)  Medications Ordered in UC Medications - No data to display  Initial Impression / Assessment and Plan / UC Course  I have reviewed the triage vital signs and the nursing notes.  Pertinent labs & imaging results that were available during my care of the patient were reviewed by me and considered in my medical decision making (see chart for details).     The COVID and influenza test are both negative.  Patient has a viral upper respiratory infection.  Discussed that it is early in the illness to be doing testing, and that if his symptoms significantly bother him a couple days from now he may want to repeat testing Final Clinical Impressions(s) / UC Diagnoses   Final diagnoses:  Acute bronchitis, unspecified organism  Flu-like symptoms     Discharge Instructions      Lots of fluids It is safe to take Coricidin HBP or Mucinex DM as needed for your cough symptoms Take ibuprofen 800 mg 3 times a day with food for your fever Call for problems.  The cough from a viral bronchitis can persist for weeks, however, should not continue to be painful    ED Prescriptions   None    PDMP not reviewed this encounter.   Raylene Everts, MD 11/29/22 [phone]

## 2022-11-29 NOTE — Discharge Instructions (Addendum)
Lots of fluids It is safe to take Coricidin HBP or Mucinex DM as needed for your cough symptoms Take ibuprofen 800 mg 3 times a day with food for your fever Call for problems.  The cough from a viral bronchitis can persist for weeks, however, should not continue to be painful

## 2022-11-29 NOTE — ED Triage Notes (Signed)
Pt c/o cough that started yesterday. Burning in chest when coughing. Denies fever. No OTC meds taken.

## 2022-11-30 ENCOUNTER — Telehealth: Payer: Self-pay | Admitting: Emergency Medicine

## 2022-11-30 NOTE — Telephone Encounter (Signed)
LMTRC.  Advised if doing well to disregard the call.  Any questions or concerns, feel free to contact the office. 

## 2022-12-01 ENCOUNTER — Ambulatory Visit
Admission: EM | Admit: 2022-12-01 | Discharge: 2022-12-01 | Disposition: A | Discharge status: Home / Self Care | Payer: Medicare Other | Attending: Emergency Medicine | Admitting: Emergency Medicine

## 2022-12-01 ENCOUNTER — Other Ambulatory Visit: Payer: Self-pay

## 2022-12-01 ENCOUNTER — Ambulatory Visit (INDEPENDENT_AMBULATORY_CARE_PROVIDER_SITE_OTHER): Payer: Medicare Other

## 2022-12-01 DIAGNOSIS — R059 Cough, unspecified: Secondary | ICD-10-CM | POA: Diagnosis not present

## 2022-12-01 DIAGNOSIS — J189 Pneumonia, unspecified organism: Secondary | ICD-10-CM

## 2022-12-01 MED ORDER — GUAIFENESIN 400 MG PO TABS: ORAL_TABLET | ORAL | 0 refills | Status: AC

## 2022-12-01 MED ORDER — AMOXICILLIN-POT CLAVULANATE 875-125 MG PO TABS: 1.0000 | ORAL_TABLET | Freq: Two times a day (BID) | ORAL | 0 refills | Status: AC

## 2022-12-01 NOTE — ED Triage Notes (Signed)
Sore throat and cough onset for four days. Neg for flu and Covid on Thursday.

## 2022-12-01 NOTE — ED Provider Notes (Signed)
[facility]    CSN: [id number] Arrival date & time: 12/01/22  7035    HISTORY   Chief Complaint  Patient presents with   Cough   HPI Brett Arnold is a pleasant, 67 y.o. male who presents to urgent care today. Patient complains of continued deep chesty cough, pain with coughing and sore throat secondary to coughing for the past 4 days.  Patient states he was seen here 2 days ago and tested negative for COVID-19 and influenza.  EMR reviewed, patient was diagnosed with bronchitis and advised to take Coricidin and Mucinex DM for symptoms.  Patient was also advised that he should anticipate a long recovery from bronchitis.  Patient states he coughed up dark-colored sputum this morning became concerned that he is not getting better but instead is getting worse.  Patient states he also needs a note to be out of work for a few more days until he feels better.  Patient denies fever, body aches, chills, nausea, vomiting, diarrhea.  Patient's oxygen saturation today is lower than it was 2 days ago.  The history is provided by the patient.   Past Medical History:  Diagnosis Date   BMI 31.0-31.9,adult    Diabetes (HCC)    Hyperlipemia    Hypertension    Patient Active Problem List   Diagnosis Date Noted   Seborrheic dermatitis 04/19/2022   BMI 31.0-31.9,adult    Diabetes (HCC)    Hypertension    Hyperlipemia    Pneumonia due to COVID-19 virus 10/17/2020   Past Surgical History:  Procedure Laterality Date   JOINT REPLACEMENT Left    knee    Home Medications    Prior to Admission medications   Medication Sig Start Date End Date Taking? Authorizing Provider  amoxicillin-clavulanate (AUGMENTIN) 875-125 MG tablet Take 1 tablet by mouth 2 (two) times daily for 5 days. 12/01/22 12/06/22 Yes Theadora Rama Scales, PA-C  guaifenesin (HUMIBID E) 400 MG TABS tablet Take 1 tablet 3 times daily as needed for chest congestion and cough 12/01/22  Yes Theadora Rama Scales, PA-C   AMBULATORY NON FORMULARY MEDICATION Supply ordered: CPAP and other supplies needed (headgear, cushions, filters, heated tuubing and water chamber) Dx: obstructive sleep apnea Settings: auto-titration 09/01/20   Sunnie Nielsen, DO  atorvastatin (LIPITOR) 80 MG tablet Take 1 tablet (80 mg total) by mouth daily. 11/09/22   Everrett Coombe, DO  ketoconazole (NIZORAL) 2 % shampoo Apply 1 Application topically 2 (two) times a week. Apply to face and scalp 04/19/22   Everrett Coombe, DO  loratadine (CLARITIN) 10 MG tablet Take 1 tablet (10 mg total) by mouth daily. 04/27/21   Sunnie Nielsen, DO  losartan (COZAAR) 25 MG tablet Take 1 tablet (25 mg total) by mouth daily. 11/08/22   Everrett Coombe, DO  metFORMIN (GLUCOPHAGE-XR) 500 MG 24 hr tablet TAKE TWo TABLETS BY MOUTH ONE TIME DAILY WITH BREAKFAST 11/08/22   Everrett Coombe, DO  Omega-3 Fatty Acids (OMEGA-3 2100 PO) Take by mouth daily.    [provider]  OVER THE COUNTER MEDICATION Take by mouth daily. Cholesterice    [provider]    Family History Family History  Problem Relation Age of Onset   Emphysema Mother    Lung disease Mother        lung cancer fluid   Hypertension Father    Stroke Father    Heart failure Brother    Social History Social History   Tobacco Use   Smoking status: Former  Years: 35.00    Types: Cigarettes    Quit date: 08/25/2017    Years since quitting: 5.2   Smokeless tobacco: Never  Vaping Use   Vaping Use: Never used  Substance Use Topics   Alcohol use: Yes    Comment: occasionally   Drug use: Not Currently   Allergies   Patient has no known allergies.  Review of Systems Review of Systems Pertinent findings revealed after performing a 14 point review of systems has been noted in the history of present illness.  Physical Exam Triage Vital Signs ED Triage Vitals  Enc Vitals Group     BP 09/01/21 0827 (!) 147/82     Pulse Rate 09/01/21 0827 72     Resp 09/01/21 0827 18      Temp 09/01/21 0827 98.3 F (36.8 C)     Temp Source 09/01/21 0827 Oral     SpO2 09/01/21 0827 98 %     Weight --      Height --      Head Circumference --      Peak Flow --      Pain Score 09/01/21 0826 5     Pain Loc --      Pain Edu? --      Excl. in GC? --   No data found.  Updated Vital Signs BP 132/84 (BP Location: Right Arm)   Pulse 76   Temp 97.9 F (36.6 C) (Oral)   Resp 16   Wt 225 lb (102.1 kg)   SpO2 96%   BMI 32.28 kg/m   Physical Exam Vitals and nursing note reviewed.  Constitutional:      General: He is not in acute distress.    Appearance: Normal appearance. He is not ill-appearing.  HENT:     Head: Normocephalic and atraumatic.     Salivary Glands: Right salivary gland is not diffusely enlarged or tender. Left salivary gland is not diffusely enlarged or tender.     Right Ear: Tympanic membrane, ear canal and external ear normal. No drainage. No middle ear effusion. There is no impacted cerumen. Tympanic membrane is not erythematous or bulging.     Left Ear: Tympanic membrane, ear canal and external ear normal. No drainage.  No middle ear effusion. There is no impacted cerumen. Tympanic membrane is not erythematous or bulging.     Nose: Nose normal. No nasal deformity, septal deviation, mucosal edema, congestion or rhinorrhea.     Right Turbinates: Not enlarged, swollen or pale.     Left Turbinates: Not enlarged, swollen or pale.     Right Sinus: No maxillary sinus tenderness or frontal sinus tenderness.     Left Sinus: No maxillary sinus tenderness or frontal sinus tenderness.     Mouth/Throat:     Lips: Pink. No lesions.     Mouth: Mucous membranes are moist. No oral lesions.     Pharynx: Oropharynx is clear. Uvula midline. No posterior oropharyngeal erythema or uvula swelling.     Tonsils: No tonsillar exudate. 0 on the right. 0 on the left.  Eyes:     General: Lids are normal.        Right eye: No discharge.        Left eye: No discharge.      Extraocular Movements: Extraocular movements intact.     Conjunctiva/sclera: Conjunctivae normal.     Right eye: Right conjunctiva is not injected.     Left eye: Left conjunctiva is not injected.  Neck:  Trachea: Trachea and phonation normal.  Cardiovascular:     Rate and Rhythm: Normal rate and regular rhythm.     Pulses: Normal pulses.     Heart sounds: Normal heart sounds. No murmur heard.    No friction rub. No gallop.  Pulmonary:     Effort: Pulmonary effort is normal. No accessory muscle usage, prolonged expiration or respiratory distress.     Breath sounds: No stridor, decreased air movement or transmitted upper airway sounds. Examination of the left-upper field reveals rales. Rales present. No decreased breath sounds, wheezing or rhonchi.  Chest:     Chest wall: No tenderness.  Musculoskeletal:        General: Normal range of motion.     Cervical back: Normal range of motion and neck supple. Normal range of motion.  Lymphadenopathy:     Cervical: No cervical adenopathy.  Skin:    General: Skin is warm and dry.     Findings: No erythema or rash.  Neurological:     General: No focal deficit present.     Mental Status: He is alert and oriented to person, place, and time.  Psychiatric:        Mood and Affect: Mood normal.        Behavior: Behavior normal.     Visual Acuity Right Eye Distance:   Left Eye Distance:   Bilateral Distance:    Right Eye Near:   Left Eye Near:    Bilateral Near:     UC Couse / Diagnostics / Procedures:     Radiology No results found.  Procedures Procedures (including critical care time) EKG  Pending results:  Labs Reviewed - No data to display  Medications Ordered in UC: Medications - No data to display  UC Diagnoses / Final Clinical Impressions(s)   I have reviewed the triage vital signs and the nursing notes.  Pertinent labs & imaging results that were available during my care of the patient were reviewed by me and  considered in my medical decision making (see chart for details).    Final diagnoses:  Community acquired pneumonia of left upper lobe of lung   Radiology report of chest x-ray is pending at this time.  Patient advised.  Patient also advised that per my personal read I believe that he is developing some pneumonia on his left upper lobe.  Recommend Augmentin twice daily for 5 days for empiric treatment of presumed community-acquired pneumonia of left upper lobe.  Patient also advised to use plain Mucinex (guaifenesin) to help promote expectoration.  Return precautions advised.  Please see discharge instructions below for further details of plan of care as provided to patient. ED Prescriptions     Medication Sig Dispense Auth. Provider   amoxicillin-clavulanate (AUGMENTIN) 875-125 MG tablet Take 1 tablet by mouth 2 (two) times daily for 5 days. 10 tablet Lynden Oxford Scales, PA-C   guaifenesin (HUMIBID E) 400 MG TABS tablet Take 1 tablet 3 times daily as needed for chest congestion and cough 21 tablet Lynden Oxford Scales, PA-C      PDMP not reviewed this encounter.  Disposition Upon Discharge:  Condition: stable for discharge home Home: take medications as prescribed; routine discharge instructions as discussed; follow up as advised.  Patient presented with an acute illness with associated systemic symptoms and significant discomfort requiring urgent management. In my opinion, this is a condition that a prudent lay person (someone who possesses an average knowledge of health and medicine) may potentially expect to result  in complications if not addressed urgently such as respiratory distress, impairment of bodily function or dysfunction of bodily organs.   Routine symptom specific, illness specific and/or disease specific instructions were discussed with the patient and/or caregiver at length.   As such, the patient has been evaluated and assessed, work-up was performed and treatment  was provided in alignment with urgent care protocols and evidence based medicine.  Patient/parent/caregiver has been advised that the patient may require follow up for further testing and treatment if the symptoms continue in spite of treatment, as clinically indicated and appropriate.  If the patient was tested for COVID-19, Influenza and/or RSV, then the patient/parent/guardian was advised to isolate at home pending the results of his/her diagnostic coronavirus test and potentially longer if they're positive. I have also advised pt that if his/her COVID-19 test returns positive, it's recommended to self-isolate for at least 10 days after symptoms first appeared AND until fever-free for 24 hours without fever reducer AND other symptoms have improved or resolved. Discussed self-isolation recommendations as well as instructions for household member/close contacts as per the [facility] [place] [facility], and also gave patient the [place] packet with this information.  Patient/parent/caregiver has been advised to return to the [facility] or PCP in 3-5 days if no better; to PCP or the Emergency Department if new signs and symptoms develop, or if the current signs or symptoms continue to change or worsen for further workup, evaluation and treatment as clinically indicated and appropriate  The patient will follow up with their current PCP if and as advised. If the patient does not currently have a PCP we will assist them in obtaining one.   The patient may need specialty follow up if the symptoms continue, in spite of conservative treatment and management, for further workup, evaluation, consultation and treatment as clinically indicated and appropriate.  Patient/parent/caregiver verbalized understanding and agreement of plan as discussed.  All questions were addressed during visit.  Please see discharge instructions below for further details of plan.  Discharge Instructions:   Discharge Instructions      The radiology report  of your x-ray is still pending however my personal read of your x-ray is that you are developing some pneumonia in your left upper lobe.  I recommend that you begin antibiotics at this time.  I have sent a prescription for Augmentin to your pharmacy, please take 1 tablet twice daily for the next 5 days.  I do not recommend Mucinex dm for daytime cough because this is a cough suppressant.  Is important that you take cough and deep breathe is much as possible to help remove the products of infection from your lungs while you are recovering.  I sent prescription for plain Mucinex to your pharmacy instead.  Please take 400 mg 2-3 times daily.  Please follow-up either with your primary care provider or return here to urgent care in the next 3 to 4 days for repeat evaluation to make sure that your breath sounds and oxygen levels are improving.  Thank you for visiting urgent care today.    This office note has been dictated using Museum/gallery curator.  Unfortunately, this method of dictation can sometimes lead to typographical or grammatical errors.  I apologize for your inconvenience in advance if this occurs.  Please do not hesitate to reach out to me if clarification is needed.      Lynden Oxford Scales, PA-C 12/01/22 1136

## 2022-12-01 NOTE — Discharge Instructions (Signed)
The radiology report of your x-ray is still pending however my personal read of your x-ray is that you are developing some pneumonia in your left upper lobe.  I recommend that you begin antibiotics at this time.  I have sent a prescription for Augmentin to your pharmacy, please take 1 tablet twice daily for the next 5 days.  I do not recommend Mucinex dm for daytime cough because this is a cough suppressant.  Is important that you take cough and deep breathe is much as possible to help remove the products of infection from your lungs while you are recovering.  I sent prescription for plain Mucinex to your pharmacy instead.  Please take 400 mg 2-3 times daily.  Please follow-up either with your primary care provider or return here to urgent care in the next 3 to 4 days for repeat evaluation to make sure that your breath sounds and oxygen levels are improving.  Thank you for visiting urgent care today.

## 2022-12-02 ENCOUNTER — Telehealth: Payer: Self-pay

## 2022-12-02 NOTE — Telephone Encounter (Signed)
TC to f/u with pt after yesterday's visit to [facility]. Pt states he is feeling about the same. Asks if he can take Tylenol w/ prescribed meds from yesterday. Instructed that he can--to follow instructions on label, not to exceed 4 g/day. Pt verbalizes understanding.

## 2023-05-20 ENCOUNTER — Ambulatory Visit: Payer: Medicare Other | Admitting: Family Medicine

## 2023-12-09 ENCOUNTER — Other Ambulatory Visit: Payer: Self-pay | Admitting: Family Medicine
